# Patient Record
Sex: Male | Born: 1975 | Race: Black or African American | Hispanic: No | Smoking: Current some day smoker
Health system: Southern US, Community
[De-identification: ages and names within clinical notes are randomized; demographics above are authoritative.]

## PROBLEM LIST (undated history)

## (undated) DIAGNOSIS — N289 Disorder of kidney and ureter, unspecified: Secondary | ICD-10-CM

## (undated) DIAGNOSIS — I10 Essential (primary) hypertension: Secondary | ICD-10-CM

## (undated) DIAGNOSIS — E119 Type 2 diabetes mellitus without complications: Secondary | ICD-10-CM

## (undated) DIAGNOSIS — K859 Acute pancreatitis without necrosis or infection, unspecified: Secondary | ICD-10-CM

## (undated) HISTORY — PX: PERITONEAL SHUNT: SHX2225

---

## 2019-12-12 ENCOUNTER — Inpatient Hospital Stay (HOSPITAL_COMMUNITY)
Admission: EM | Admit: 2019-12-12 | Discharge: 2019-12-15 | DRG: 919 | Disposition: A | Discharge status: Home / Self Care | Payer: Medicaid - Out of State | Attending: Internal Medicine | Admitting: Internal Medicine

## 2019-12-12 ENCOUNTER — Emergency Department (HOSPITAL_COMMUNITY): Payer: Medicaid - Out of State

## 2019-12-12 ENCOUNTER — Other Ambulatory Visit: Payer: Self-pay

## 2019-12-12 ENCOUNTER — Encounter (HOSPITAL_COMMUNITY): Payer: Self-pay | Admitting: Emergency Medicine

## 2019-12-12 DIAGNOSIS — R231 Pallor: Secondary | ICD-10-CM | POA: Diagnosis present

## 2019-12-12 DIAGNOSIS — Y812 Prosthetic and other implants, materials and accessory general- and plastic-surgery devices associated with adverse incidents: Secondary | ICD-10-CM | POA: Diagnosis present

## 2019-12-12 DIAGNOSIS — R109 Unspecified abdominal pain: Secondary | ICD-10-CM | POA: Diagnosis present

## 2019-12-12 DIAGNOSIS — T85611A Breakdown (mechanical) of intraperitoneal dialysis catheter, initial encounter: Principal | ICD-10-CM | POA: Diagnosis present

## 2019-12-12 DIAGNOSIS — K859 Acute pancreatitis without necrosis or infection, unspecified: Secondary | ICD-10-CM | POA: Insufficient documentation

## 2019-12-12 DIAGNOSIS — F17210 Nicotine dependence, cigarettes, uncomplicated: Secondary | ICD-10-CM | POA: Diagnosis present

## 2019-12-12 DIAGNOSIS — E785 Hyperlipidemia, unspecified: Secondary | ICD-10-CM | POA: Diagnosis present

## 2019-12-12 DIAGNOSIS — I120 Hypertensive chronic kidney disease with stage 5 chronic kidney disease or end stage renal disease: Secondary | ICD-10-CM | POA: Diagnosis present

## 2019-12-12 DIAGNOSIS — Z992 Dependence on renal dialysis: Secondary | ICD-10-CM

## 2019-12-12 DIAGNOSIS — N2581 Secondary hyperparathyroidism of renal origin: Secondary | ICD-10-CM | POA: Diagnosis present

## 2019-12-12 DIAGNOSIS — N186 End stage renal disease: Secondary | ICD-10-CM | POA: Diagnosis present

## 2019-12-12 DIAGNOSIS — Z79899 Other long term (current) drug therapy: Secondary | ICD-10-CM

## 2019-12-12 DIAGNOSIS — E1165 Type 2 diabetes mellitus with hyperglycemia: Secondary | ICD-10-CM | POA: Diagnosis present

## 2019-12-12 DIAGNOSIS — K5900 Constipation, unspecified: Secondary | ICD-10-CM | POA: Diagnosis present

## 2019-12-12 DIAGNOSIS — E875 Hyperkalemia: Secondary | ICD-10-CM | POA: Diagnosis present

## 2019-12-12 DIAGNOSIS — K852 Alcohol induced acute pancreatitis without necrosis or infection: Secondary | ICD-10-CM | POA: Diagnosis present

## 2019-12-12 DIAGNOSIS — E1169 Type 2 diabetes mellitus with other specified complication: Secondary | ICD-10-CM | POA: Diagnosis not present

## 2019-12-12 DIAGNOSIS — Z794 Long term (current) use of insulin: Secondary | ICD-10-CM | POA: Diagnosis not present

## 2019-12-12 DIAGNOSIS — K861 Other chronic pancreatitis: Secondary | ICD-10-CM

## 2019-12-12 DIAGNOSIS — K860 Alcohol-induced chronic pancreatitis: Secondary | ICD-10-CM | POA: Diagnosis present

## 2019-12-12 DIAGNOSIS — D631 Anemia in chronic kidney disease: Secondary | ICD-10-CM | POA: Diagnosis present

## 2019-12-12 DIAGNOSIS — Z20822 Contact with and (suspected) exposure to covid-19: Secondary | ICD-10-CM | POA: Diagnosis present

## 2019-12-12 DIAGNOSIS — I10 Essential (primary) hypertension: Secondary | ICD-10-CM | POA: Diagnosis not present

## 2019-12-12 DIAGNOSIS — T85611S Breakdown (mechanical) of intraperitoneal dialysis catheter, sequela: Secondary | ICD-10-CM | POA: Diagnosis not present

## 2019-12-12 DIAGNOSIS — E1122 Type 2 diabetes mellitus with diabetic chronic kidney disease: Secondary | ICD-10-CM | POA: Diagnosis present

## 2019-12-12 DIAGNOSIS — K219 Gastro-esophageal reflux disease without esophagitis: Secondary | ICD-10-CM | POA: Diagnosis present

## 2019-12-12 HISTORY — DX: Acute pancreatitis without necrosis or infection, unspecified: K85.90

## 2019-12-12 HISTORY — DX: Disorder of kidney and ureter, unspecified: N28.9

## 2019-12-12 HISTORY — DX: Essential (primary) hypertension: I10

## 2019-12-12 HISTORY — DX: Type 2 diabetes mellitus without complications: E11.9

## 2019-12-12 LAB — BODY FLUID CELL COUNT WITH DIFFERENTIAL: Total Nucleated Cell Count, Fluid: 6 cu mm (ref 0–1000)

## 2019-12-12 LAB — COMPREHENSIVE METABOLIC PANEL
ALT: 13 U/L (ref 0–44)
AST: 12 U/L — ABNORMAL LOW (ref 15–41)
Albumin: 3.3 g/dL — ABNORMAL LOW (ref 3.5–5.0)
Alkaline Phosphatase: 92 U/L (ref 38–126)
Anion gap: 11 (ref 5–15)
BUN: 54 mg/dL — ABNORMAL HIGH (ref 6–20)
CO2: 22 mmol/L (ref 22–32)
Calcium: 8.6 mg/dL — ABNORMAL LOW (ref 8.9–10.3)
Chloride: 99 mmol/L (ref 98–111)
Creatinine, Ser: 9.85 mg/dL — ABNORMAL HIGH (ref 0.61–1.24)
GFR calc Af Amer: 7 mL/min — ABNORMAL LOW (ref >60)
GFR calc non Af Amer: 6 mL/min — ABNORMAL LOW (ref >60)
Glucose, Bld: 332 mg/dL — ABNORMAL HIGH (ref 70–99)
Potassium: 5.5 mmol/L — ABNORMAL HIGH (ref 3.5–5.1)
Sodium: 132 mmol/L — ABNORMAL LOW (ref 135–145)
Total Bilirubin: 0.6 mg/dL (ref 0.3–1.2)
Total Protein: 6.5 g/dL (ref 6.5–8.1)

## 2019-12-12 LAB — GRAM STAIN

## 2019-12-12 LAB — CBC
HCT: 40.2 % (ref 39.0–52.0)
Hemoglobin: 12.3 g/dL — ABNORMAL LOW (ref 13.0–17.0)
MCH: 29.6 pg (ref 26.0–34.0)
MCHC: 30.6 g/dL (ref 30.0–36.0)
MCV: 96.6 fL (ref 80.0–100.0)
Platelets: 275 10*3/uL (ref 150–400)
RBC: 4.16 MIL/uL — ABNORMAL LOW (ref 4.22–5.81)
RDW: 14.6 % (ref 11.5–15.5)
WBC: 5.6 10*3/uL (ref 4.0–10.5)
nRBC: 0 % (ref 0.0–0.2)

## 2019-12-12 LAB — CBG MONITORING, ED: Glucose-Capillary: 251 mg/dL — ABNORMAL HIGH (ref 70–99)

## 2019-12-12 LAB — LIPASE, BLOOD: Lipase: 46 U/L (ref 11–51)

## 2019-12-12 LAB — SARS CORONAVIRUS 2 BY RT PCR (HOSPITAL ORDER, PERFORMED IN ~~LOC~~ HOSPITAL LAB): SARS Coronavirus 2: NEGATIVE

## 2019-12-12 MED ORDER — CALCITRIOL 0.25 MCG PO CAPS
0.2500 ug | ORAL_CAPSULE | Freq: Every day | ORAL | Status: DC
  Administered 2019-12-12 – 2019-12-14 (×3): 0.25 ug via ORAL
  Filled 2019-12-12 (×5): qty 1

## 2019-12-12 MED ORDER — MORPHINE SULFATE (PF) 4 MG/ML IV SOLN
4.0000 mg | Freq: Once | INTRAVENOUS | Status: AC
  Administered 2019-12-12: 4 mg via INTRAVENOUS
  Filled 2019-12-12: qty 1

## 2019-12-12 MED ORDER — FUROSEMIDE 10 MG/ML IJ SOLN
40.0000 mg | Freq: Every day | INTRAMUSCULAR | Status: DC
  Administered 2019-12-12 – 2019-12-14 (×3): 40 mg via INTRAVENOUS
  Filled 2019-12-12 (×3): qty 4

## 2019-12-12 MED ORDER — DIPHENHYDRAMINE HCL 50 MG/ML IJ SOLN
25.0000 mg | Freq: Once | INTRAMUSCULAR | Status: AC
  Administered 2019-12-12: 25 mg via INTRAVENOUS
  Filled 2019-12-12: qty 1

## 2019-12-12 MED ORDER — AMLODIPINE BESYLATE 5 MG PO TABS: 10.0000 mg | ORAL_TABLET | Freq: Every day | ORAL | Status: DC

## 2019-12-12 MED ORDER — FENTANYL CITRATE (PF) 100 MCG/2ML IJ SOLN: 25.0000 ug | INTRAMUSCULAR | Status: DC | PRN

## 2019-12-12 MED ORDER — ONDANSETRON 4 MG PO TBDP
4.0000 mg | ORAL_TABLET | Freq: Once | ORAL | Status: AC | PRN
  Administered 2019-12-12: 4 mg via ORAL
  Filled 2019-12-12: qty 1

## 2019-12-12 MED ORDER — SODIUM ZIRCONIUM CYCLOSILICATE 10 G PO PACK
10.0000 g | PACK | Freq: Three times a day (TID) | ORAL | Status: AC
  Administered 2019-12-12 – 2019-12-13 (×3): 10 g via ORAL
  Filled 2019-12-12: qty 1
  Filled 2019-12-12: qty 2
  Filled 2019-12-12: qty 1

## 2019-12-12 MED ORDER — ACETAMINOPHEN 325 MG PO TABS
650.0000 mg | ORAL_TABLET | Freq: Four times a day (QID) | ORAL | Status: DC | PRN
  Administered 2019-12-13: 650 mg via ORAL
  Filled 2019-12-12: qty 2

## 2019-12-12 MED ORDER — METOPROLOL TARTRATE 50 MG PO TABS: 50.0000 mg | ORAL_TABLET | Freq: Two times a day (BID) | ORAL | Status: DC

## 2019-12-12 MED ORDER — PANTOPRAZOLE SODIUM 40 MG PO TBEC: 40.0000 mg | DELAYED_RELEASE_TABLET | Freq: Every day | ORAL | Status: DC

## 2019-12-12 MED ORDER — INSULIN ASPART 100 UNIT/ML ~~LOC~~ SOLN
0.0000 [IU] | Freq: Three times a day (TID) | SUBCUTANEOUS | Status: DC
  Administered 2019-12-13: 1 [IU] via SUBCUTANEOUS
  Administered 2019-12-13: 3 [IU] via SUBCUTANEOUS
  Administered 2019-12-13: 2 [IU] via SUBCUTANEOUS
  Administered 2019-12-14: 3 [IU] via SUBCUTANEOUS
  Administered 2019-12-14: 5 [IU] via SUBCUTANEOUS
  Administered 2019-12-14 – 2019-12-15 (×2): 1 [IU] via SUBCUTANEOUS
  Administered 2019-12-15: 3 [IU] via SUBCUTANEOUS

## 2019-12-12 MED ORDER — ONDANSETRON HCL 4 MG/2ML IJ SOLN: 4.0000 mg | Freq: Four times a day (QID) | INTRAMUSCULAR | Status: DC | PRN

## 2019-12-12 MED ORDER — HYDROMORPHONE HCL 1 MG/ML IJ SOLN
1.0000 mg | Freq: Once | INTRAMUSCULAR | Status: AC
  Administered 2019-12-12: 1 mg via INTRAVENOUS
  Filled 2019-12-12: qty 1

## 2019-12-12 MED ORDER — ACETAMINOPHEN 650 MG RE SUPP: 650.0000 mg | Freq: Four times a day (QID) | RECTAL | Status: DC | PRN

## 2019-12-12 MED ORDER — PANTOPRAZOLE SODIUM 40 MG PO TBEC
40.0000 mg | DELAYED_RELEASE_TABLET | Freq: Every day | ORAL | Status: DC
  Administered 2019-12-13 – 2019-12-14 (×2): 40 mg via ORAL
  Filled 2019-12-12 (×2): qty 1

## 2019-12-12 MED ORDER — FENTANYL CITRATE (PF) 100 MCG/2ML IJ SOLN: 12.5000 ug | INTRAMUSCULAR | Status: DC | PRN

## 2019-12-12 MED ORDER — HEPARIN SODIUM (PORCINE) 5000 UNIT/ML IJ SOLN
5000.0000 [IU] | Freq: Three times a day (TID) | INTRAMUSCULAR | Status: DC
  Administered 2019-12-12 – 2019-12-14 (×6): 5000 [IU] via SUBCUTANEOUS
  Filled 2019-12-12 (×7): qty 1

## 2019-12-12 MED ORDER — ONDANSETRON HCL 4 MG PO TABS: 4.0000 mg | ORAL_TABLET | Freq: Four times a day (QID) | ORAL | Status: DC | PRN

## 2019-12-12 MED ORDER — HYDRALAZINE HCL 50 MG PO TABS
100.0000 mg | ORAL_TABLET | Freq: Three times a day (TID) | ORAL | Status: DC
  Administered 2019-12-13 – 2019-12-14 (×7): 100 mg via ORAL
  Filled 2019-12-12 (×3): qty 4
  Filled 2019-12-12 (×4): qty 2
  Filled 2019-12-12: qty 4

## 2019-12-12 MED ORDER — HYDROMORPHONE HCL 1 MG/ML IJ SOLN
1.0000 mg | INTRAMUSCULAR | Status: DC | PRN
  Administered 2019-12-12 – 2019-12-14 (×9): 1 mg via INTRAVENOUS
  Filled 2019-12-12 (×9): qty 1

## 2019-12-12 MED ORDER — METOPROLOL TARTRATE 50 MG PO TABS
50.0000 mg | ORAL_TABLET | Freq: Two times a day (BID) | ORAL | Status: DC
  Administered 2019-12-13 – 2019-12-14 (×4): 50 mg via ORAL
  Filled 2019-12-12 (×5): qty 1

## 2019-12-12 MED ORDER — AMLODIPINE BESYLATE 10 MG PO TABS
10.0000 mg | ORAL_TABLET | Freq: Every day | ORAL | Status: DC
  Administered 2019-12-13 – 2019-12-14 (×2): 10 mg via ORAL
  Filled 2019-12-12: qty 1
  Filled 2019-12-12: qty 2

## 2019-12-12 MED ORDER — HYDRALAZINE HCL 25 MG PO TABS: 100.0000 mg | ORAL_TABLET | Freq: Three times a day (TID) | ORAL | Status: DC

## 2019-12-12 NOTE — ED Notes (Signed)
Pt in bed, pt states that he doesn't want the fentanyl, states that it doesn't help with his pain, pt requests more of the dilaudid.  md notified, md states that due to renal function he would like to give fentanyl and increased does, pt made aware, pt still would prefer dilaudid and would like to speak with md, md texted.

## 2019-12-12 NOTE — ED Provider Notes (Signed)
Emergency Department Provider Note   I have reviewed the triage vital signs and the nursing notes.   HISTORY  Chief Complaint Abdominal Pain   HPI Nathan Black is a 44 y.o. male with PMH reviewed below including chronic pancreatitis presents to the ED with abdominal pain and nausea.  Patient has had 2 days of symptoms which are progressively worsening.  He does peritoneal dialysis and over the past 2 nights suspect that he is not getting complete runs of dialysis.  He is having pain with nausea and some vomiting although mainly nausea today.  Pain is in the upper to mid abdomen and constant with no modifying factors.  He tells me that the equipment at home tells him to "check drain line" but does not see any obvious malfunction.  Denies any fevers or chills.  No pain around the drain site. No SOB or CP.  Past Medical History:  Diagnosis Date  . Diabetes mellitus without complication ([place])   . Hypertension   . Pancreatitis   . Renal disorder     Patient Active Problem List   Diagnosis Date Noted  . Acute pancreatitis 12/12/2019  . Chronic alcoholic pancreatitis ([place]) 12/12/2019  . Essential hypertension 12/12/2019  . Type 2 diabetes mellitus with other specified complication ([place]) 85/92/9244  . ESRD (end stage renal disease) ([place]) 12/12/2019  . 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 PERITONEAL SHUNT      Allergies Lisinopril  History reviewed. No pertinent family history.  Social History Social History   Tobacco Use  . Smoking status: Current Some Day Smoker    Packs/day: 1.00    Types: Cigarettes  . Smokeless tobacco: Never Used  Substance Use Topics  . Alcohol use: Not Currently  . Drug use: Not Currently    Review of Systems  Constitutional: No fever/chills Eyes: No visual changes. ENT: No sore throat. Cardiovascular: Denies chest pain. Respiratory: Denies  shortness of breath. Gastrointestinal: Positive abdominal pain. Positive nausea and vomiting.  No diarrhea.  No constipation. Genitourinary: Negative for dysuria. Musculoskeletal: Negative for back pain. Skin: Negative for rash. Neurological: Negative for headaches, focal weakness or numbness.  10-point ROS otherwise negative.  ____________________________________________   PHYSICAL EXAM:  VITAL SIGNS: ED Triage Vitals  Enc Vitals Group     BP 12/12/19 1138 (!) 204/95     Pulse Rate 12/12/19 1138 67     Resp 12/12/19 1138 20     Temp 12/12/19 1138 98.2 F (36.8 C)     Temp Source 12/12/19 1138 Oral     SpO2 12/12/19 1138 99 %     Weight 12/12/19 1141 152 lb (68.9 kg)     Height 12/12/19 1141 $RemoveBefor'5\' 7"'[id number]$  (1.702 m)   Constitutional: Alert and oriented. Well appearing and in no acute distress. Eyes: Conjunctivae are normal.  Head: Atraumatic. Nose: No congestion/rhinnorhea. Mouth/Throat: Mucous membranes are moist.   Neck: No stridor.  Cardiovascular: Normal rate, regular rhythm. Good peripheral circulation. Grossly normal heart sounds.   Respiratory: Normal respiratory effort.  No retractions. Lungs CTAB. Gastrointestinal: Soft with mild epigastric tenderness. No rebound or guarding. No tenderness around the dialysis cath line. No distention.  Musculoskeletal: No gross deformities of extremities. Neurologic:  Normal speech and language.  Skin:  Skin is warm, dry and intact. No rash noted.   ____________________________________________   LABS (all labs ordered are listed, but only abnormal results are displayed)  Labs Reviewed  COMPREHENSIVE METABOLIC PANEL - Abnormal; Notable for the following components:      Result Value   Sodium 132 (*)    Potassium 5.5 (*)    Glucose, Bld 332 (*)    BUN 54 (*)    Creatinine, Ser 9.85 (*)    Calcium 8.6 (*)    Albumin 3.3 (*)    AST 12 (*)    GFR calc non Af Amer 6 (*)    GFR calc Af Amer 7 (*)    All other components within  normal limits  CBC - Abnormal; Notable for the following components:   RBC 4.16 (*)    Hemoglobin 12.3 (*)    All other components within normal limits  BODY FLUID CELL COUNT WITH DIFFERENTIAL - Abnormal; Notable for the following components:   Color, Fluid COLORLESS (*)    All other components within normal limits  CBG MONITORING, ED - Abnormal; Notable for the following components:   Glucose-Capillary 251 (*)    All other components within normal limits  GRAM STAIN  SARS CORONAVIRUS 2 BY RT PCR (HOSPITAL ORDER, [place] [facility])  BODY FLUID CULTURE  LIPASE, BLOOD  PATHOLOGIST SMEAR REVIEW  HIV ANTIBODY (ROUTINE TESTING W REFLEX)  BASIC METABOLIC PANEL  CBC  HEMOGLOBIN A1C   ____________________________________________  RADIOLOGY  CT ABDOMEN PELVIS WO CONTRAST  Result Date: 12/12/2019 CLINICAL DATA:  Abdominal pain, nausea and vomiting, peritoneal dialysis EXAM: CT ABDOMEN AND PELVIS WITHOUT CONTRAST TECHNIQUE: Multidetector CT imaging of the abdomen and pelvis was performed following the standard protocol without IV contrast. COMPARISON:  03/15/2019 FINDINGS: Lower chest: No acute pleural or parenchymal lung disease. Hepatobiliary: No focal liver abnormality is seen. No gallstones, gallbladder wall thickening, or biliary dilatation. Pancreas: Diffuse pancreatic calcifications consistent with chronic calcific pancreatitis. No acute inflammatory change. Spleen: Normal in size without focal abnormality. Adrenals/Urinary Tract: No urinary tract calculi or obstructive uropathy. The adrenals are unremarkable. The bladder is normal. Stomach/Bowel: No bowel obstruction or ileus. No wall thickening or inflammatory change. Normal appendix right lower quadrant. Vascular/Lymphatic: Aortic atherosclerosis. No enlarged abdominal or pelvic lymph nodes. Reproductive: Prostate is unremarkable. Other: Small volume free fluid throughout the abdomen and pelvis likely related to  peritoneal dialysis. Peritoneal dialysis catheter is coiled within the left upper quadrant. No free intraperitoneal gas. No abdominal wall hernia. Musculoskeletal: No acute or destructive bony lesions. Reconstructed images demonstrate no additional findings. IMPRESSION: 1. Small volume of free fluid throughout the abdomen and pelvis likely reflecting residual fluid from peritoneal dialysis. 2. Sequela of chronic calcific pancreatitis. 3. Otherwise unremarkable unenhanced exam. Electronically Signed   By: Randa Ngo M.D.   On: 12/12/2019 15:48    ____________________________________________   PROCEDURES  Procedure(s) performed:   Procedures  None  ____________________________________________   INITIAL IMPRESSION / ASSESSMENT AND PLAN / ED COURSE  Pertinent labs & imaging results that were available during my care of the patient were reviewed by me and considered in my medical decision making (see chart for details).   Patient presents to the emergency department abdominal pain with nausea vomiting.  He arrives hypertensive with mid to epigastric abdominal tenderness.  I attempted to draw peritoneal fluid using sterile technique via PD cath but was only able to obtain a small amount of clear, nonbloody fluid.  We will send this for cell count but did not have enough to send for culture Gram stain.  Plan for noncontrast CT of the abdomen and pelvis.   05:00 PM  Labs reviewed. No concern for SBP on review of peritoneal fluid. Will send for culture if I have enough to send. CT with no acute findings. Patient does have a Quaniyah Bugh history of chronic pancreatitis and a calcified pancreas on CT. Question chronic pancreatitis flare with normal lipase here. Plan to keep patient NPO and admit for bowel rest.  Discussed patient's case with [facility] to request admission. Patient and family (if present) updated with plan. Care transferred to [facility] service.  I reviewed all nursing notes, vitals, pertinent old  records, EKGs, labs, imaging (as available).  ____________________________________________  FINAL CLINICAL IMPRESSION(S) / ED DIAGNOSES  Final diagnoses:  Chronic pancreatitis, unspecified pancreatitis type ([place])    MEDICATIONS GIVEN DURING THIS VISIT:  Medications  calcitRIOL (ROCALTROL) capsule 0.25 mcg (0.25 mcg Oral Given 12/12/19 2033)  acetaminophen (TYLENOL) tablet 650 mg (has no administration in time range)    Or  acetaminophen (TYLENOL) suppository 650 mg (has no administration in time range)  ondansetron (ZOFRAN) tablet 4 mg (has no administration in time range)    Or  ondansetron (ZOFRAN) injection 4 mg (has no administration in time range)  furosemide (LASIX) injection 40 mg (40 mg Intravenous Given 12/12/19 2034)  sodium zirconium cyclosilicate (LOKELMA) packet 10 g (has no administration in time range)  insulin aspart (novoLOG) injection 0-6 Units (has no administration in time range)  heparin injection 5,000 Units (has no administration in time range)  pantoprazole (PROTONIX) EC tablet 40 mg (has no administration in time range)  metoprolol tartrate (LOPRESSOR) tablet 50 mg (has no administration in time range)  hydrALAZINE (APRESOLINE) tablet 100 mg (has no administration in time range)  amLODipine (NORVASC) tablet 10 mg (has no administration in time range)  fentaNYL (SUBLIMAZE) injection 25 mcg (has no administration in time range)  ondansetron (ZOFRAN-ODT) disintegrating tablet 4 mg (4 mg Oral Given 12/12/19 1148)  morphine 4 MG/ML injection 4 mg (4 mg Intravenous Given 12/12/19 1530)  diphenhydrAMINE (BENADRYL) injection 25 mg (25 mg Intravenous Given 12/12/19 1551)  HYDROmorphone (DILAUDID) injection 1 mg (1 mg Intravenous Given 12/12/19 1801)    Note:  This document was prepared using Dragon voice recognition software and may include unintentional dictation errors.  Nanda Quinton, MD, [facility]    Iyauna Sing, [facility], MD 12/12/19 2223

## 2019-12-12 NOTE — ED Triage Notes (Signed)
Patient is having abdominal pain, performs peritoneal dialysis last 5-6 months, last night equipment stated check drain line, doesn't believe treatment was complete, currently having lower abdominal pain.

## 2019-12-12 NOTE — ED Notes (Signed)
Pt in bed, pt appears to have some hives on his L arm, pt appears to have a localized allergic response to the morphine, md notified, benadryl given

## 2019-12-12 NOTE — H&P (Signed)
History and Physical    [name]:[id number] DOB: [birth year] DOA: 12/12/2019  PCP: Patient, No Pcp Per   Patient coming from: home   Chief Complaint: abdominal pain.   HPI: Nathan Black is a 44 y.o. male with medical history significant of end-stage renal disease on peritoneal dialysis, type 2 diabetes mellitus, hypertension and history of chronic alcoholic pancreatitis. Patient reports not feeling well for the last 2 days, generalized malaise, poor appetite, but no frank fevers or chills.  He does peritoneal dialysis at home, last night his dialysis machine alerted him malfunction of the peritoneal drain.  He was not able to complete the full peritoneal dialysis yesterday.  He reports severe abdominal pain for the last 24 hours, mainly on the left side, worse to touch and movement, no improving factors, associated with nausea and vomiting.  Denies any sick contacts, he has not been vaccinated for COVID-19.  ED Course: Patient with severe abdominal pain despite intravenous morphine, work-up included CT of the abdomen with changes consistent with chronic pancreatitis, pancreatic enzymes within normal ranges, peritoneal fluid sampling negative for infection.  He was referred to the hospital with a working diagnosis of possible chronic pancreatitis flare.   Review of Systems:  1. General: No fevers, no chills, no poor appetites and generalized malaise.  2. ENT: No runny nose or sore throat, no hearing disturbances 3. Pulmonary: No dyspnea, cough, wheezing, or hemoptysis 4. Cardiovascular: No angina, claudication, lower extremity edema, pnd or orthopnea 5. Gastrointestinal: positive nausea and vomiting, no diarrhea or constipation. Abdominal pain as mentioned in HPI.  6. Hematology: No easy bruisability or frequent infections 7. Urology: No dysuria, hematuria or increased urinary frequency. Continue ,making urine.  8. Dermatology: No rashes. 9. Neurology: No seizures or paresthesias 10.  Musculoskeletal: No joint pain or deformities  Past Medical History:  Diagnosis Date  . Diabetes mellitus without complication ([place])   . Hypertension   . Pancreatitis   . Renal disorder     Past Surgical History:  Procedure Laterality Date  . PERITONEAL SHUNT       reports that he has been smoking cigarettes. He has been smoking about 1.00 pack per day. He has never used smokeless tobacco. He reports previous alcohol use. He reports previous drug use.  Allergies  Allergen Reactions  . Lisinopril     swelling    History reviewed. No pertinent family history.   Prior to Admission medications   Medication Sig Start Date End Date Taking? Authorizing Provider  amLODipine (NORVASC) 5 MG tablet Take 5 mg by mouth daily.   Yes [provider]  atorvastatin (LIPITOR) 40 MG tablet Take 40 mg by mouth daily.   Yes [provider]  calcitRIOL (ROCALTROL) 0.25 MCG capsule Take 0.25 mcg by mouth daily.   Yes [provider]  furosemide (LASIX) 40 MG tablet Take 40 mg by mouth daily.   Yes [provider]  hydrALAZINE (APRESOLINE) 100 MG tablet Take 100 mg by mouth 3 (three) times daily.   Yes [provider]  metoprolol tartrate (LOPRESSOR) 50 MG tablet Take 50 mg by mouth 2 (two) times daily.   Yes [provider]  pantoprazole (PROTONIX) 40 MG tablet Take 40 mg by mouth daily.   Yes [provider]    Physical Exam: Vitals:   12/12/19 1138 12/12/19 1141 12/12/19 1400 12/12/19 1553  BP: (!) 204/95  (!) 210/81 (!) 192/116  Pulse: 67  65 (!) 58  Resp: 20   18  Temp: 98.2 F (36.8 C)  98.2 F (36.8 C)   TempSrc: Oral  Oral   SpO2: 99%  100% 100%  Weight:  68.9 kg    Height:  $Remove'5\' 7"'sRrFtgI$  (1.702 m)      Vitals:   12/12/19 1138 12/12/19 1141 12/12/19 1400 12/12/19 1553  BP: (!) 204/95  (!) 210/81 (!) 192/116  Pulse: 67  65 (!) 58  Resp: 20   18  Temp: 98.2 F (36.8 C)  98.2 F (36.8 C)   TempSrc: Oral  Oral   SpO2: 99%   100% 100%  Weight:  68.9 kg    Height:  $Remove'5\' 7"'huyghWC$  (1.702 m)     General: deconditioned and in pain.  Neurology: Awake and alert, non focal Head and Neck. Head normocephalic. Neck supple with no adenopathy or thyromegaly.   E ENT: positive pallor, no icterus, oral mucosa moist Cardiovascular: No JVD. S1-S2 present, rhythmic, no gallops, rubs, or murmurs. Trace bilateral non pitting lower extremity edema. Pulmonary: vesicular breath sounds bilaterally, adequate air movement, no wheezing, rhonchi or rales. Gastrointestinal. Abdomen soft but tender to superficial palpation. Peritoneal catheter in place, with no local erythema.  Skin. No rashes Musculoskeletal: no joint deformities    Labs on Admission: I have personally reviewed following labs and imaging studies  CBC: Recent Labs  Lab 12/12/19 1425  WBC 5.6  HGB 12.3*  HCT 40.2  MCV 96.6  PLT 355   Basic Metabolic Panel: Recent Labs  Lab 12/12/19 1425  NA 132*  K 5.5*  CL 99  CO2 22  GLUCOSE 332*  BUN 54*  CREATININE 9.85*  CALCIUM 8.6*   GFR: Estimated Creatinine Clearance: 8.9 mL/min (A) (by C-G formula based on SCr of 9.85 mg/dL (H)). Liver Function Tests: Recent Labs  Lab 12/12/19 1425  AST 12*  ALT 13  ALKPHOS 92  BILITOT 0.6  PROT 6.5  ALBUMIN 3.3*   Recent Labs  Lab 12/12/19 1425  LIPASE 46   No results for input(s): AMMONIA in the last 168 hours. Coagulation Profile: No results for input(s): INR, PROTIME in the last 168 hours. Cardiac Enzymes: No results for input(s): CKTOTAL, CKMB, CKMBINDEX, TROPONINI in the last 168 hours. BNP (last 3 results) No results for input(s): PROBNP in the last 8760 hours. HbA1C: No results for input(s): HGBA1C in the last 72 hours. CBG: No results for input(s): GLUCAP in the last 168 hours. Lipid Profile: No results for input(s): CHOL, HDL, LDLCALC, TRIG, CHOLHDL, LDLDIRECT in the last 72 hours. Thyroid Function Tests: No results for input(s): TSH, T4TOTAL,  FREET4, T3FREE, THYROIDAB in the last 72 hours. Anemia Panel: No results for input(s): VITAMINB12, FOLATE, FERRITIN, TIBC, IRON, RETICCTPCT in the last 72 hours. Urine analysis: No results found for: COLORURINE, APPEARANCEUR, LABSPEC, [place], GLUCOSEU, HGBUR, BILIRUBINUR, KETONESUR, PROTEINUR, UROBILINOGEN, NITRITE, LEUKOCYTESUR  Radiological Exams on Admission: CT ABDOMEN PELVIS WO CONTRAST  Result Date: 12/12/2019 CLINICAL DATA:  Abdominal pain, nausea and vomiting, peritoneal dialysis EXAM: CT ABDOMEN AND PELVIS WITHOUT CONTRAST TECHNIQUE: Multidetector CT imaging of the abdomen and pelvis was performed following the standard protocol without IV contrast. COMPARISON:  03/15/2019 FINDINGS: Lower chest: No acute pleural or parenchymal lung disease. Hepatobiliary: No focal liver abnormality is seen. No gallstones, gallbladder wall thickening, or biliary dilatation. Pancreas: Diffuse pancreatic calcifications consistent with chronic calcific pancreatitis. No acute inflammatory change. Spleen: Normal in size without focal abnormality. Adrenals/Urinary Tract: No urinary tract calculi or obstructive uropathy. The adrenals are unremarkable. The bladder is normal. Stomach/Bowel: No  bowel obstruction or ileus. No wall thickening or inflammatory change. Normal appendix right lower quadrant. Vascular/Lymphatic: Aortic atherosclerosis. No enlarged abdominal or pelvic lymph nodes. Reproductive: Prostate is unremarkable. Other: Small volume free fluid throughout the abdomen and pelvis likely related to peritoneal dialysis. Peritoneal dialysis catheter is coiled within the left upper quadrant. No free intraperitoneal gas. No abdominal wall hernia. Musculoskeletal: No acute or destructive bony lesions. Reconstructed images demonstrate no additional findings. IMPRESSION: 1. Small volume of free fluid throughout the abdomen and pelvis likely reflecting residual fluid from peritoneal dialysis. 2. Sequela of chronic  calcific pancreatitis. 3. Otherwise unremarkable unenhanced exam. Electronically Signed   By: Randa Ngo M.D.   On: 12/12/2019 15:48    EKG: Independently reviewed. 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  He had difficulties performing home peritoneal dialysis yesterday due to possible catheter malfunction.  For the last 2 days not feeling well with generalized malaise, poor appetite, nausea and vomiting.  Positive abdominal pain on the left side, moderate to severe in intensity.  On his initial physical examination temperature 98.5, blood pressure 209/109, heart rate 78, respiratory rate 17, oxygen saturation 100%.  Mild pallor, lungs clear to auscultation bilaterally, heart S1-S2, present rhythmic, abdomen tender to superficial palpation in the left side, peritoneal dialysis in place, no site erythema, positive bilateral trace lower extremity edema. Sodium 132, potassium 5.5, chloride 99, bicarb 22, glucose 332, BUN 54, creatinine 9.8, lipase 46, AST 12, ALT 13, white count 5.6, hemoglobin 12.3, hematocrit 40.2, platelets 275.  SARS COVID-19 pending.  Peritoneal fluid with 6 nucleated cells.  CT of the abdomen with small volume of free fluid throughout the abdomen and pelvis residual fluid from peritoneal dialysis.  Chronic sequela I of calcific pancreatitis.   Patient will be admitted to the hospital working diagnosis of abdominal pain possible recurrence of pancreatitis, rule out dialysis catheter  malfunction.   1.  Abdominal pain, possible acute exacerbation of chronic pancreatitis, rule out dialysis catheter malfunction. Continue supportive medical care with intravenous analgesics with fentanyl, clear liquid diet, antiacids and as needed antiemetics. Consult nephrology to continue peritoneal dialysis.  2.  End-stage renal disease, peritoneal dialysis/hyperkalemia.  Patient with no severe volume overload, bicarb down to 22.  Potassium 5.5.  No indication for urgent dialysis, will add sodium zirconium x3 doses for hyperkalemia and follow-up kidney function in the morning.  Consult nephrology to continue peritoneal dialysis during his hospitalization.  3.  Uncontrolled hypertension.  Continue hydralazine 100 mg 3 times daily and metoprolol 50 mg twice daily.  Will increase amlodipine to 10 mg and will change furosemide to intravenous formulation 40 mg daily.  4.  GERD.  Continue pantoprazole  5.  Type 2 diabetes mellitus (uncontrolled/hyperglycemia) /dyslipidemia..  Add insulin sliding scale for glucose coverage and monitoring.  Check hemoglobin A1c. Continue atorvastatin.  Status is: Inpatient  Remains inpatient appropriate because:IV treatments appropriate due to intensity of illness or inability to take PO   Dispo: The patient is from: Home  Anticipated d/c is to: Home              Anticipated d/c date is: 2 days              Patient currently is not medically stable to d/c.    DVT prophylaxis: Heparin   Code Status:   full  Family Communication:  No family at the beside     Consults called:  Nephrology   Admission status:  Inpatient.    Jailyne Chieffo Gerome Apley MD [facility]   12/12/2019, 5:23 PM

## 2019-12-13 DIAGNOSIS — E1169 Type 2 diabetes mellitus with other specified complication: Secondary | ICD-10-CM

## 2019-12-13 DIAGNOSIS — Z794 Long term (current) use of insulin: Secondary | ICD-10-CM

## 2019-12-13 LAB — GLUCOSE, CAPILLARY
Glucose-Capillary: 154 mg/dL — ABNORMAL HIGH (ref 70–99)
Glucose-Capillary: 176 mg/dL — ABNORMAL HIGH (ref 70–99)
Glucose-Capillary: 199 mg/dL — ABNORMAL HIGH (ref 70–99)
Glucose-Capillary: 213 mg/dL — ABNORMAL HIGH (ref 70–99)
Glucose-Capillary: 245 mg/dL — ABNORMAL HIGH (ref 70–99)
Glucose-Capillary: 270 mg/dL — ABNORMAL HIGH (ref 70–99)

## 2019-12-13 LAB — CBC
HCT: 32.5 % — ABNORMAL LOW (ref 39.0–52.0)
Hemoglobin: 10.2 g/dL — ABNORMAL LOW (ref 13.0–17.0)
MCH: 29.4 pg (ref 26.0–34.0)
MCHC: 31.4 g/dL (ref 30.0–36.0)
MCV: 93.7 fL (ref 80.0–100.0)
Platelets: 225 10*3/uL (ref 150–400)
RBC: 3.47 MIL/uL — ABNORMAL LOW (ref 4.22–5.81)
RDW: 14.3 % (ref 11.5–15.5)
WBC: 4.3 10*3/uL (ref 4.0–10.5)
nRBC: 0 % (ref 0.0–0.2)

## 2019-12-13 LAB — BASIC METABOLIC PANEL
Anion gap: 9 (ref 5–15)
BUN: 58 mg/dL — ABNORMAL HIGH (ref 6–20)
CO2: 23 mmol/L (ref 22–32)
Calcium: 8.1 mg/dL — ABNORMAL LOW (ref 8.9–10.3)
Chloride: 99 mmol/L (ref 98–111)
Creatinine, Ser: 10.01 mg/dL — ABNORMAL HIGH (ref 0.61–1.24)
GFR calc Af Amer: 7 mL/min — ABNORMAL LOW (ref >60)
GFR calc non Af Amer: 6 mL/min — ABNORMAL LOW (ref >60)
Glucose, Bld: 291 mg/dL — ABNORMAL HIGH (ref 70–99)
Potassium: 4.6 mmol/L (ref 3.5–5.1)
Sodium: 131 mmol/L — ABNORMAL LOW (ref 135–145)

## 2019-12-13 LAB — LIPASE, BLOOD: Lipase: 59 U/L — ABNORMAL HIGH (ref 11–51)

## 2019-12-13 LAB — MRSA PCR SCREENING: MRSA by PCR: NEGATIVE

## 2019-12-13 LAB — HEMOGLOBIN A1C
Hgb A1c MFr Bld: 11.2 % — ABNORMAL HIGH (ref 4.8–5.6)
Mean Plasma Glucose: 274.74 mg/dL

## 2019-12-13 LAB — PROCALCITONIN: Procalcitonin: 0.14 ng/mL

## 2019-12-13 LAB — HIV ANTIBODY (ROUTINE TESTING W REFLEX): HIV Screen 4th Generation wRfx: NONREACTIVE

## 2019-12-13 LAB — PATHOLOGIST SMEAR REVIEW

## 2019-12-13 MED ORDER — SIMETHICONE 40 MG/0.6ML PO SUSP
ORAL | Status: AC
  Filled 2019-12-13: qty 0.6

## 2019-12-13 MED ORDER — OXYCODONE HCL 5 MG PO TABS
5.0000 mg | ORAL_TABLET | ORAL | Status: DC | PRN
  Administered 2019-12-13 – 2019-12-14 (×5): 10 mg via ORAL
  Filled 2019-12-13 (×5): qty 2

## 2019-12-13 MED ORDER — NICOTINE 21 MG/24HR TD PT24
21.0000 mg | MEDICATED_PATCH | Freq: Every day | TRANSDERMAL | Status: DC
  Administered 2019-12-13 – 2019-12-14 (×2): 21 mg via TRANSDERMAL
  Filled 2019-12-13 (×2): qty 1

## 2019-12-13 NOTE — Progress Notes (Signed)
Care link here to transport patient. Report called to [facility].

## 2019-12-13 NOTE — Consult Note (Signed)
ESRD Consult Note [facility]  Requesting provider: Karie Kirks, DO Reason for consult: ESRD, PD catheter malfunction  Outpatient dialysis unit: [place] [facility] dialysis schedule: PD  Assessment/Recommendations: Nathan Black is a/an 44 y.o. male with a past medical history notable for ESRD on PD admitted with abdominal pain.   # ESRD on CCPD: orders per patient: 7hrs 20 min, 4 cycles, 1.5hr dwell time. Mainly utilizes 1.25 or 2.5% bags. No day dwell -needs to be transferred to [facility] to have his home surgeons address catheter malfunction. Unable to do PD at [facility] unfortunately. No urgency/indication for renal replacement therapy at this time, volume status and electrolytes are okay. Not exhibiting any uremic symptoms  # Volume/ hypertension: overall euvolemic. Continue with home anti-HTN's. Spikes in BP likely related to abdominal pain  # Anemia of Chronic Kidney Disease: Hemoglobin 10.2.  # Secondary Hyperparathyroidism/Hyperphosphatemia: check phos and albumin   # Vascular access: PD cath site c/d/i  # Abdominal pain/chronic pancreatitis: supportive care, mgmt per primary service -Preferred narcotic agents for pain control are hydromorphone, fentanyl, and methadone. Morphine should not be used. Avoid Baclofen and avoid oral sodium phosphate and magnesium citrate based laxatives / bowel preps.  # Additional recommendations: - Dose all meds for creatinine clearance < 10 ml/min  - Unless absolutely necessary, no MRIs with gadolinium.  - Implement save arm precautions.  Prefer needle sticks in the dorsum of the hands or wrists.  No blood pressure measurements in arm. - If blood transfusion is requested during hemodialysis sessions, please alert [place] prior to the session.  - If a dialysis catheter line culture is requested, please alert [place] as only hemodialysis nurses are able to collect those specimens.   Recommendations were discussed with the primary  team.   History of Present Illness: Nathan Black is a/an 44 y.o. male with a past medical history of ESRD on PD, DM2, HTN, HLD who presents with abdominal pain. Has been having difficulty performing PD due to drain alarms and noticed that he is not draining as effectively as before. Still might have some fluid in his abdomen due to the fact that EMS arrived when he was still on PD. Denies any changes in fluid characteristics in PD effluent. Never had similar issues with PD in the past. Has poor appetite and n/v associated with abdominal pain. Denies fevers, chest pain, SOB, diminished urinary frequency. Still makes urine. No similar issues in the past   Medications:  Current Facility-Administered Medications  Medication Dose Route Frequency Provider Last Rate Last Admin  . acetaminophen (TYLENOL) tablet 650 mg  650 mg Oral Q6H PRN Arrien, Jimmy Picket, MD   650 mg at 12/13/19 1037   Or  . acetaminophen (TYLENOL) suppository 650 mg  650 mg Rectal Q6H PRN Arrien, Jimmy Picket, MD      . amLODipine (NORVASC) tablet 10 mg  10 mg Oral Daily Tawni Millers, MD   10 mg at 12/13/19 0626  . calcitRIOL (ROCALTROL) capsule 0.25 mcg  0.25 mcg Oral Daily Arrien, Jimmy Picket, MD   0.25 mcg at 12/13/19 1032  . furosemide (LASIX) injection 40 mg  40 mg Intravenous Daily Arrien, Jimmy Picket, MD   40 mg at 12/13/19 1032  . heparin injection 5,000 Units  5,000 Units Subcutaneous Q8H Arrien, Jimmy Picket, MD   5,000 Units at 12/13/19 [phone]  . hydrALAZINE (APRESOLINE) tablet 100 mg  100 mg Oral TID Tawni Millers, MD   100 mg at 12/13/19 1031  . HYDROmorphone (  DILAUDID) injection 1 mg  1 mg Intravenous Q4H PRN Arrien, Jimmy Picket, MD   1 mg at 12/13/19 0824  . insulin aspart (novoLOG) injection 0-6 Units  0-6 Units Subcutaneous TID WC Arrien, Jimmy Picket, MD   3 Units at 12/13/19 [phone]  . metoprolol tartrate (LOPRESSOR) tablet 50 mg  50 mg Oral BID Tawni Millers, MD    50 mg at 12/13/19 3474  . ondansetron (ZOFRAN) tablet 4 mg  4 mg Oral Q6H PRN Arrien, Jimmy Picket, MD       Or  . ondansetron [facility]) injection 4 mg  4 mg Intravenous Q6H PRN Arrien, Jimmy Picket, MD      . pantoprazole (PROTONIX) EC tablet 40 mg  40 mg Oral Daily Tawni Millers, MD   40 mg at 12/13/19 2595  . simethicone (MYLICON) 40 GL/8.7FI suspension           . sodium zirconium cyclosilicate (LOKELMA) packet 10 g  10 g Oral TID Tawni Millers, MD   10 g at 12/13/19 1031     ALLERGIES Lisinopril  MEDICAL HISTORY Past Medical History:  Diagnosis Date  . Diabetes mellitus without complication ([place])   . Hypertension   . Pancreatitis   . Renal disorder      SOCIAL HISTORY Social History   Socioeconomic History  . Marital status: Legally Separated    Spouse name: Not on file  . Number of children: Not on file  . Years of education: Not on file  . Highest education level: Not on file  Occupational History  . Not on file  Tobacco Use  . Smoking status: Current Some Day Smoker    Packs/day: 1.00    Types: Cigarettes  . Smokeless tobacco: Never Used  Substance and Sexual Activity  . Alcohol use: Not Currently  . Drug use: Not Currently  . Sexual activity: Not on file  Other Topics Concern  . Not on file  Social History Narrative  . Not on file   Social Determinants of Health   Financial Resource Strain:   . Difficulty of Paying Living Expenses: Not on file  Food Insecurity:   . Worried About Charity fundraiser in the Last Year: Not on file  . Ran Out of Food in the Last Year: Not on file  Transportation Needs:   . Lack of Transportation (Medical): Not on file  . Lack of Transportation (Non-Medical): Not on file  Physical Activity:   . Days of Exercise per Week: Not on file  . Minutes of Exercise per Session: Not on file  Stress:   . Feeling of Stress : Not on file  Social Connections:   . Frequency of Communication with Friends and  Family: Not on file  . Frequency of Social Gatherings with Friends and Family: Not on file  . Attends Religious Services: Not on file  . Active Member of Clubs or Organizations: Not on file  . Attends Archivist Meetings: Not on file  . Marital Status: Not on file  Intimate Partner Violence:   . Fear of Current or Ex-Partner: Not on file  . Emotionally Abused: Not on file  . Physically Abused: Not on file  . Sexually Abused: Not on file     FAMILY HISTORY History reviewed. No pertinent family history.   Review of Systems: 12 systems were reviewed and negative except per HPI  Physical Exam: Vitals:   12/13/19 0625 12/13/19 0830  BP: (!) 153/78 (!) 145/90  Pulse: 63 63  Resp:  18  Temp:  98 F (36.7 C)  SpO2:  98%   No intake/output data recorded.  Intake/Output Summary (Last 24 hours) at 12/13/2019 1104 Last data filed at 12/13/2019 0600 Gross per 24 hour  Intake 720 ml  Output --  Net 720 ml   General: well-appearing, no acute distress HEENT: anicteric sclera, MMM CV: normal rate, no murmurs, no edema Lungs: bilateral chest rise, normal wob Abd: soft, slightly distended, tender to palpation in epigastric and LLQ regions. PD site: c/d/i, no tunnel tenderness Skin: no visible lesions or rashes Psych: alert, engaged, appropriate mood and affect Neuro: normal speech, no gross focal deficits   Test Results Reviewed Lab Results  Component Value Date   NA 131 (L) 12/13/2019   K 4.6 12/13/2019   CL 99 12/13/2019   CO2 23 12/13/2019   BUN 58 (H) 12/13/2019   CREATININE 10.01 (H) 12/13/2019   CALCIUM 8.1 (L) 12/13/2019   ALBUMIN 3.3 (L) 12/12/2019    I have reviewed relevant outside healthcare records

## 2019-12-13 NOTE — Progress Notes (Signed)
PROGRESS NOTE  Nathan Black XBM:[id number] DOB: 1975/10/09 DOA: 12/12/2019 PCP: Patient, No Pcp Per  Brief History   Nathan Black is a 44 y.o. male with medical history significant of end-stage renal disease on peritoneal dialysis, type 2 diabetes mellitus, hypertension and history of chronic alcoholic pancreatitis. Patient reports not feeling well for the last 2 days, generalized malaise, poor appetite, but no frank fevers or chills.  He does peritoneal dialysis at home, last night his dialysis machine alerted him malfunction of the peritoneal drain.  He was not able to complete the full peritoneal dialysis yesterday.  He reports severe abdominal pain for the last 24 hours, mainly on the left side, worse to touch and movement, no improving factors, associated with nausea and vomiting.  Denies any sick contacts, he has not been vaccinated for COVID-19.  [facility] were consulted to admit the patient for further treatment and evaluation of malfunctioning PD catheter and abdominal pain.  As it seems that [facility] cannot provide any PD catheter/dialysis related needs, attempts were made to transfer the patient to [place], RN discussed the patient with Dr. Hulen Skains, [place]. He stated that as the patient's catheter was placed in [place], they should accept the patient back despite diversion status. The patient was discussed both with the hospitalist who was on for admissions and the house supervisor. They both stated that they were not accepting transfers from any outside facilities and were on diversion. The patient will be transferred to [facility] as he will likely require HD as his PD catheter is not working.   Consultants  . Nephrology  Procedures  . None  Antibiotics   Anti-infectives (From admission, onward)   None    .  Subjective  The patient is sitting up on the edge of his bed. He continues to complain of abdominal pain. No new  complaints.  Objective   Vitals:  Vitals:   12/13/19 0625 12/13/19 0830  BP: (!) 153/78 (!) 145/90  Pulse: 63 63  Resp:  18  Temp:  98 F (36.7 C)  SpO2:  98%   Exam:  Constitutional:  . The patient is awake, alert, and oriented x 3. No acute distress. Respiratory:  . No increased work of breathing. . No wheezes, rales, or rhonchi . No tactile fremitus Cardiovascular:  . Regular rate and rhythm . No murmurs, ectopy, or gallups. . No lateral PMI. No thrills. Abdomen:  . Abdomen is soft,  non-distended . The abdomen is tender, especially in the left upper quadrant. . No hernias, masses, or organomegaly . Hypoactive bowel sounds.  Musculoskeletal:  . No cyanosis, clubbing, or edema Skin:  . No rashes, lesions, ulcers . palpation of skin: no induration or nodules Neurologic:  . CN 2-12 intact . Sensation all 4 extremities intact Psychiatric:  . Mental status o Mood, affect appropriate o Orientation to person, place, time  . judgment and insight appear intact   I have personally reviewed the following:   Today's Data  . Vitals, BMP, CBC, procalcitonin  Micro Data  . Body fluid culture (12/12/2019) No growth . Blood cultures x 2(12/13/2019): Pending  Imaging  CT abd/pelvis: 1. Small volume of free fluid throughout the abdomen and pelvis likely reflecting residual fluid from peritoneal dialysis. 2. Sequela of chronic calcific pancreatitis. . 3. Otherwise unremarkable unenhanced exam.  Scheduled Meds: . amLODipine  10 mg Oral Daily  . calcitRIOL  0.25 mcg Oral Daily  . furosemide  40  mg Intravenous Daily  . heparin injection (subcutaneous)  5,000 Units Subcutaneous Q8H  . hydrALAZINE  100 mg Oral TID  . insulin aspart  0-6 Units Subcutaneous TID WC  . metoprolol tartrate  50 mg Oral BID  . pantoprazole  40 mg Oral Daily  . simethicone      . sodium zirconium cyclosilicate  10 g Oral TID   Continuous Infusions:  Principal Problem:   Acute  pancreatitis Active Problems:   Chronic alcoholic pancreatitis ([place])   Essential hypertension   Type 2 diabetes mellitus with other specified complication (HCC)   ESRD (end stage renal disease) (HCC)   Dependence on continuous ambulatory peritoneal dialysis ([place])   LOS: 1 day   A & P  Abdominal pain, possible acute exacerbation of chronic pancreatitis: Peritoneal fluid obtained prior to admission has had no growth and does not appear to be infected. procalcitonin is negative. CT lends some support to possible exacerbation of chronic pancreatitis. The patient is on a clear liquid diet. He is receiving pain control and antiemetics.  End-stage renal disease, peritoneal dialysis/hyperkalemia:  Patient with no severe volume overload, bicarb down to 22.  Potassium 5.5. No indication for urgent dialysis, will add sodium zirconium x3 doses for hyperkalemia and follow-up kidney function in the morning. Nephrology is aware of the patient's transfer and need for dialysis. No urgent need today as electrolytes and CO2 are within normal limits.  PD Catheter malfunction: Per the patient it was not functioning properly, and he was unable to dialyze yesterday. Several attempts were made to transfer the patient to a facility which could evaluate and address the peritoneal dialysis catheter and fix it. Unfortunately, they were unable to accept the patient as they were on diversion. Nephrology is aware of the patient's transfer and need for dialysis. No urgent need today.  Uncontrolled hypertension: Continue hydralazine 100 mg 3 times daily and metoprolol 50 mg twice daily.  Will increase amlodipine to 10 mg and will change furosemide to intravenous formulation 40 mg daily. Blood pressures are improved today.  GERD: PPI  Type 2 diabetes mellitus (uncontrolled/hyperglycemia) /dyslipidemia: Hemoglobin A1c is 11.2. Glucoses over the past 24 hours have been 251-270. Add insulin sliding scale for glucose coverage  and monitoring. Will add Lantus 10 units daily for better control.   Hyperlipidemia: Continue atorvastatin.  I have seen and examined this patient myself. I have spent 64 minutes in his evaluation and care.  Status is: Inpatient  Remains inpatient appropriate because:IV treatments appropriate due to intensity of illness or inability to take PO   Dispo: The patient is from: Home  Anticipated d/c is to: Home  Anticipated d/c date is: 2 days  Patient currently is not medically stable to d/c.   Zamyah Wiesman, DO [facility] Direct contact: see [email or web address]  7PM-7AM contact night coverage as above 12/13/2019, 2:37 PM  LOS: 1 day

## 2019-12-13 NOTE — Progress Notes (Signed)
Inpatient Diabetes Program Recommendations  AACE/ADA: New Consensus Statement on Inpatient Glycemic Control (2015)  Target Ranges:  Prepandial:   less than 140 mg/dL      Peak postprandial:   less than 180 mg/dL (1-2 hours)      Critically ill patients:  140 - 180 mg/dL   Lab Results  Component Value Date   GLUCAP 270 (H) 12/13/2019   HGBA1C 11.2 (H) 12/12/2019    Review of Glycemic Control  Diabetes history: DM 2 Outpatient Diabetes medications: Humalog SSI, Lantus 22 units qhs (per pt report) Current orders for Inpatient glycemic control:  Novolog 0-6 units tid   Inpatient Diabetes Program Recommendations:    - Add Lantus 10 units (1/2 home dose)  Spoke with pt regarding A1c level at home. Pt reports last seeing MD for Diabetes 5 months ago (however MD has been changed and he has to make a new appt with the new MD from the [place]). Pt started peritoneal dialysis about 4-5 months ago after seeing his MD. Peritoneal fluid often times has dextrose in it. Pt reports at times glucose is 600 at home. Misses Lantus dose 1-2 times in a 2 week period. Take Humalog all the time for glucose >200. Discussed A1c level. Discussed glucose and A1c goals. Pt has all supplies at home he needs. Understands the need for glucose control at home. Pt will schedule follow up for insulin adjustments.  Thanks, Tama Headings RN, MSN, BC-ADM Inpatient Diabetes Coordinator Team Pager [phone] (8a-5p)

## 2019-12-14 DIAGNOSIS — T85611S Breakdown (mechanical) of intraperitoneal dialysis catheter, sequela: Secondary | ICD-10-CM

## 2019-12-14 DIAGNOSIS — K861 Other chronic pancreatitis: Secondary | ICD-10-CM

## 2019-12-14 LAB — GLUCOSE, CAPILLARY
Glucose-Capillary: 296 mg/dL — ABNORMAL HIGH (ref 70–99)
Glucose-Capillary: 161 mg/dL — ABNORMAL HIGH (ref 70–99)
Glucose-Capillary: 440 mg/dL — ABNORMAL HIGH (ref 70–99)
Glucose-Capillary: 197 mg/dL — ABNORMAL HIGH (ref 70–99)
Glucose-Capillary: 200 mg/dL — ABNORMAL HIGH (ref 70–99)

## 2019-12-14 MED ORDER — POLYETHYLENE GLYCOL 3350 17 G PO PACK
17.0000 g | PACK | Freq: Two times a day (BID) | ORAL | Status: DC
  Administered 2019-12-14: 17 g via ORAL
  Filled 2019-12-14: qty 1

## 2019-12-14 MED ORDER — SORBITOL 70 % SOLN
30.0000 mL | Freq: Every day | Status: DC | PRN
  Administered 2019-12-14: 30 mL via ORAL
  Filled 2019-12-14: qty 30

## 2019-12-14 MED ORDER — OXYCODONE HCL 5 MG PO TABS
5.0000 mg | ORAL_TABLET | ORAL | Status: DC | PRN
  Administered 2019-12-14 – 2019-12-15 (×5): 5 mg via ORAL
  Filled 2019-12-14 (×5): qty 1

## 2019-12-14 MED ORDER — POLYETHYLENE GLYCOL 3350 17 G PO PACK
17.0000 g | PACK | Freq: Every day | ORAL | Status: DC
  Administered 2019-12-14: 17 g via ORAL
  Filled 2019-12-14: qty 1

## 2019-12-14 MED ORDER — GENTAMICIN SULFATE 0.1 % EX CREA
1.0000 "application " | TOPICAL_CREAM | Freq: Every day | CUTANEOUS | Status: DC
  Administered 2019-12-14 (×2): 1 via TOPICAL
  Filled 2019-12-14: qty 15

## 2019-12-14 MED ORDER — HYDROMORPHONE HCL 1 MG/ML IJ SOLN
0.5000 mg | INTRAMUSCULAR | Status: DC | PRN
Start: 2019-12-14 — End: 2019-12-14

## 2019-12-14 MED ORDER — HEPARIN SODIUM (PORCINE) 5000 UNIT/ML IJ SOLN: 5000.0000 [IU] | Freq: Three times a day (TID) | INTRAMUSCULAR | Status: DC

## 2019-12-14 MED ORDER — INSULIN GLARGINE 100 UNIT/ML ~~LOC~~ SOLN
10.0000 [IU] | Freq: Every day | SUBCUTANEOUS | Status: DC
  Administered 2019-12-14: 10 [IU] via SUBCUTANEOUS
  Filled 2019-12-14 (×2): qty 0.1

## 2019-12-14 MED ORDER — HEPARIN 1000 UNIT/ML FOR PERITONEAL DIALYSIS
500.0000 [IU] | INTRAMUSCULAR | Status: DC | PRN
  Filled 2019-12-14: qty 0.5

## 2019-12-14 MED ORDER — SORBITOL 70 % SOLN
960.0000 mL | TOPICAL_OIL | Freq: Once | ORAL | Status: DC
  Filled 2019-12-14: qty 473

## 2019-12-14 MED ORDER — HYDROMORPHONE HCL 1 MG/ML IJ SOLN
0.5000 mg | INTRAMUSCULAR | Status: DC | PRN
  Administered 2019-12-14 – 2019-12-15 (×5): 0.5 mg via INTRAVENOUS
  Filled 2019-12-14 (×6): qty 1

## 2019-12-14 MED ORDER — CHLORHEXIDINE GLUCONATE CLOTH 2 % EX PADS
6.0000 | MEDICATED_PAD | Freq: Every day | CUTANEOUS | Status: DC
  Administered 2019-12-14 – 2019-12-15 (×2): 6 via TOPICAL

## 2019-12-14 MED ORDER — INSULIN ASPART 100 UNIT/ML ~~LOC~~ SOLN
5.0000 [IU] | Freq: Three times a day (TID) | SUBCUTANEOUS | Status: DC
  Administered 2019-12-14 – 2019-12-15 (×2): 5 [IU] via SUBCUTANEOUS

## 2019-12-14 MED ORDER — CEFAZOLIN SODIUM-DEXTROSE 2-4 GM/100ML-% IV SOLN
2.0000 g | INTRAVENOUS | Status: AC
  Administered 2019-12-15: 2 g via INTRAVENOUS

## 2019-12-14 NOTE — Progress Notes (Signed)
New Admission Note:   Arrival Method: transfer from [facility] via carelink Mental Orientation: Alert and oriented x4 Telemetry: 5M08, CCMD notified Assessment: Completed Skin: Intact, warm and dry IV: LFA, saline locked Pain: 8/10, provided pain medication Tubes: PD catheter, RLQ Safety Measures: Safety Fall Prevention Plan has been discussed  Admission: completed 5 [place] Orientation: Patient has been orientated to the room, unit and staff.   Family: none at bedside  Orders to be reviewed and implemented. Will continue to monitor the patient. Call light has been placed within reach and bed alarm has been activated.

## 2019-12-14 NOTE — Plan of Care (Signed)
  Problem: Activity: Goal: Risk for activity intolerance will decrease Outcome: Progressing   

## 2019-12-14 NOTE — Plan of Care (Signed)
  Problem: Pain Managment: Goal: General experience of comfort will improve Outcome: Progressing   

## 2019-12-14 NOTE — Progress Notes (Signed)
[facility] Progress Note  Subjective: having pains in his L mid and upper abd, no N/V.  rec'd miralax and sorbitol w/o results yet. No SOB. Voiding a lot of urine.   Vitals:   12/13/19 2033 12/13/19 2328 12/14/19 0310 12/14/19 0800  BP: (!) 162/87 (!) 145/82 140/79 138/79  Pulse: 64 62 61 63  Resp: $Remo'20 18 18 16  'gPAFW$ Temp: 98.1 F (36.7 C) 98.6 F (37 C) 98.2 F (36.8 C) 98 F (36.7 C)  TempSrc:  Oral Oral Oral  SpO2: 100% 100% 98% 99%  Weight:  66 kg    Height:  $Remove'5\' 7"'qLbLVyx$  (1.702 m)      Exam: General: well-appearing, no acute distress HEENT: anicteric sclera, MMM CV: normal rate, no murmurs, no edema Lungs: bilateral chest rise, normal wob Abd: soft, slightly distended, tender to palpation in epigastric and LLQ regions. PD site: c/d/i, no tunnel tenderness Skin: no visible lesions or rashes Psych: alert, engaged, appropriate mood and affect Neuro: normal speech, no gross focal deficits     OP PD: 4 exchange, 1.5h dwell, 1.5 and 2.5% mix , no day bag, last creat 9   Assessment/ Plan: # ESRD on CCPD: pt having difficulty w/ draining, not draining effectively for last 3-4 days. 1st time w/ this issue.  Also has had abd pain and N/V for last few days and is getting laxatives now in case of constipation, but he's not sure that he is constipated given recent GI c/o's.  CT scan shows PD coiled tip in the L mid to upper abdomen in an anterior position. Pt is not uremic and still making urine. PD fluid is not infected.  Creat is 10. I think the catheter needs to be repositioned down in the pelvis but we don't do that here.  Can be done as an outpatient. Have d/w pt's renal MD in [place] (Dr Elita Quick), plan is to place [facility] here, get him dialyzed, get him into HD unit in [place] and dc home, they will take care of getting him to a PD cath specialist in OP setting Rob Hickman, [place]) as they are having issues w/ PD cath surgeon availability as well up in [place].  Have explained to pt that the HD  should be only temporary and that it is very likely that his PD cath is still viable.   # Volume/ hypertension: overall euvolemic. Continue with home anti-HTN's.   # Anemia of Chronic Kidney Disease: Hemoglobin 10.2.  # Secondary Hyperparathyroidism/Hyperphosphatemia: check phos and albumin   # Vascular access: PD cath site c/d/i  # Abdominal pain/chronic pancreatitis: supportive care, mgmt per primary service     Rob Jaimen Melone 12/14/2019, 10:29 AM   Recent Labs  Lab 12/12/19 1425 12/13/19 0602  K 5.5* 4.6  BUN 54* 58*  CREATININE 9.85* 10.01*  CALCIUM 8.6* 8.1*  HGB 12.3* 10.2*   Inpatient medications:  amLODipine  10 mg Oral Daily   calcitRIOL  0.25 mcg Oral Daily   furosemide  40 mg Intravenous Daily   gentamicin cream  1 application Topical Daily   heparin injection (subcutaneous)  5,000 Units Subcutaneous Q8H   hydrALAZINE  100 mg Oral TID   insulin aspart  0-6 Units Subcutaneous TID WC   metoprolol tartrate  50 mg Oral BID   nicotine  21 mg Transdermal Daily   pantoprazole  40 mg Oral Daily   polyethylene glycol  17 g Oral Daily   sorbitol, milk of mag, mineral oil, glycerin (SMOG) enema  960  mL Rectal Once    acetaminophen **OR** acetaminophen, heparin, HYDROmorphone (DILAUDID) injection, ondansetron **OR** ondansetron (ZOFRAN) IV, oxyCODONE, sorbitol

## 2019-12-14 NOTE — Progress Notes (Signed)
Progress Note    Nathan Black  QIW:[id number] DOB: 06/04/1975  DOA: 12/12/2019 PCP: Patient, No Pcp Per    Brief Narrative:     Medical records reviewed and are as summarized below:  Nathan Black is an 44 y.o. male  with medical history significant of end-stage renal disease on peritoneal dialysis, type 2 diabetes mellitus, hypertension and history of chronic alcoholic pancreatitis. Patient reports not feeling well for the last 2 days, generalized malaise, poor appetite, but no frank fevers or chills.  He does peritoneal dialysis at home, last night his dialysis machine alerted him malfunction of the peritoneal drain.  He was not able to complete the full peritoneal dialysis yesterday.  He reports severe abdominal pain for the last 24 hours, mainly on the left side, worse to touch and movement, no improving factors, associated with nausea and vomiting.  No BM for several days.    Assessment/Plan:   Active Problems:   Chronic alcoholic pancreatitis ([place])   Essential hypertension   Type 2 diabetes mellitus with other specified complication (HCC)   ESRD (end stage renal disease) (HCC)   Dependence on continuous ambulatory peritoneal dialysis [facility])   Abdominal pain, possible acute exacerbation of chronic pancreatitis vs constipation Continue supportive medical care with intravenous analgesics with fentanyl, clear liquid diet, antiacids and as needed antiemetics. Consult nephrology to continue peritoneal dialysis.   End-stage renal disease, peritoneal dialysis/hyperkalemia.  Patient with no severe volume overload, bicarb down to 22.  Potassium 5.5.  No indication for urgent dialysis, will add sodium zirconium x3 doses for hyperkalemia and follow-up kidney function in the morning.  Consult nephrology to continue peritoneal dialysis during his hospitalization.  Uncontrolled hypertension.   -Continue hydralazine 100 mg 3 times daily and metoprolol 50 mg twice daily.   -increase  amlodipine to 10 mg    GERD.   -Continue pantoprazole  Type 2 diabetes mellitus (uncontrolled/hyperglycemia) /dyslipidemia..  - SSI -HgbA1c: 11.2  Constipation -bowel regimen -has been "forgetting" to take his miralax at home  Tobacco abuse -nicotine patch   Family Communication/Anticipated D/C date and plan/Code Status   DVT prophylaxis: heparin Code Status: Full Code.  Disposition Plan: Status is: Inpatient  Remains inpatient appropriate because:Inpatient level of care appropriate due to severity of illness   Dispo: The patient is from: Home              Anticipated d/c is to: Home              Anticipated d/c date is: 3 days              Patient currently is not medically stable to d/c.         Medical Consultants:    Nephrology    Subjective:   C/o pain in his left side  Objective:    Vitals:   12/13/19 2328 12/14/19 0310 12/14/19 0800 12/14/19 1146  BP: (!) 145/82 140/79 138/79 (!) 144/87  Pulse: 62 61 63 (!) 59  Resp: $Remo'18 18 16 18  'BxspY$ Temp: 98.6 F (37 C) 98.2 F (36.8 C) 98 F (36.7 C) 98.4 F (36.9 C)  TempSrc: Oral Oral Oral Oral  SpO2: 100% 98% 99% 99%  Weight: 66 kg     Height: $Remove'5\' 7"'dJeNIMX$  (1.702 m)       Intake/Output Summary (Last 24 hours) at 12/14/2019 1207 Last data filed at 12/14/2019 0900 Gross per 24 hour  Intake 480 ml  Output 0 ml  Net 480 ml  Filed Weights   12/12/19 1141 12/13/19 0015 12/13/19 2328  Weight: 68.9 kg 65.4 kg 66 kg    Exam:  General: Appearance:    Well developed, well nourished male in no acute distress     Lungs:     Clear to auscultation bilaterally, respirations unlabored  Heart:    Bradycardic. Normal rhythm. No murmurs, rubs, or gallops.   MS:   All extremities are intact.   Neurologic:   Awake, alert, oriented x 3. No apparent focal neurological           defect.     Data Reviewed:   I have personally reviewed following labs and imaging studies:  Labs: Labs show the following:   Basic  Metabolic Panel: Recent Labs  Lab 12/12/19 1425 12/13/19 0602  NA 132* 131*  K 5.5* 4.6  CL 99 99  CO2 22 23  GLUCOSE 332* 291*  BUN 54* 58*  CREATININE 9.85* 10.01*  CALCIUM 8.6* 8.1*   GFR Estimated Creatinine Clearance: 8.8 mL/min (A) (by C-G formula based on SCr of 10.01 mg/dL (H)). Liver Function Tests: Recent Labs  Lab 12/12/19 1425  AST 12*  ALT 13  ALKPHOS 92  BILITOT 0.6  PROT 6.5  ALBUMIN 3.3*   Recent Labs  Lab 12/12/19 1425 12/13/19 0932  LIPASE 46 59*   No results for input(s): AMMONIA in the last 168 hours. Coagulation profile No results for input(s): INR, PROTIME in the last 168 hours.  CBC: Recent Labs  Lab 12/12/19 1425 12/13/19 0602  WBC 5.6 4.3  HGB 12.3* 10.2*  HCT 40.2 32.5*  MCV 96.6 93.7  PLT 275 225   Cardiac Enzymes: No results for input(s): CKTOTAL, CKMB, CKMBINDEX, TROPONINI in the last 168 hours. BNP (last 3 results) No results for input(s): PROBNP in the last 8760 hours. CBG: Recent Labs  Lab 12/13/19 1656 12/13/19 2037 12/13/19 2330 12/14/19 0636 12/14/19 1144  GLUCAP 154* 176* 199* 296* 161*   D-Dimer: No results for input(s): DDIMER in the last 72 hours. Hgb A1c: Recent Labs    12/12/19 1425  HGBA1C 11.2*   Lipid Profile: No results for input(s): CHOL, HDL, LDLCALC, TRIG, CHOLHDL, LDLDIRECT in the last 72 hours. Thyroid function studies: No results for input(s): TSH, T4TOTAL, T3FREE, THYROIDAB in the last 72 hours.  Invalid input(s): FREET3 Anemia work up: No results for input(s): VITAMINB12, FOLATE, FERRITIN, TIBC, IRON, RETICCTPCT in the last 72 hours. Sepsis Labs: Recent Labs  Lab 12/12/19 1425 12/13/19 0602 12/13/19 0932  PROCALCITON  --   --  0.14  WBC 5.6 4.3  --     Microbiology Recent Results (from the past 240 hour(s))  Gram stain     Status: None   Collection Time: 12/12/19  3:05 PM   Specimen: Peritoneal Washings; Body Fluid  Result Value Ref Range Status   Specimen Description  PERITONEAL  Final   Special Requests NONE  Final   Gram Stain   Final    Performed at [facility] NO WBC SEEN NO ORGANISMS SEEN Performed at [facility], [address], [place], [place] [address]    Report Status 12/12/2019 FINAL  Final  Body fluid culture     Status: None (Preliminary result)   Collection Time: 12/12/19  3:05 PM   Specimen: Peritoneal Washings  Result Value Ref Range Status   Specimen Description   Final    PERITONEAL Performed at [facility], [address], [place], [place] [address]    Special  Requests   Final    NONE Performed at [facility], [address], [place], [place] [address]    Gram Stain NO WBC SEEN NO ORGANISMS SEEN CYTOSPIN SMEAR   Final   Culture   Final    NO GROWTH 2 DAYS Performed at [facility], [place] [address], [place], [place] [address]    Report Status PENDING  Incomplete  SARS Coronavirus 2 by RT PCR (hospital order, performed in [facility] hospital lab) Nasopharyngeal Nasopharyngeal Swab     Status: None   Collection Time: 12/12/19  5:50 PM   Specimen: Nasopharyngeal Swab  Result Value Ref Range Status   SARS Coronavirus 2 NEGATIVE NEGATIVE Final    Comment: (NOTE) SARS-CoV-2 target nucleic acids are NOT DETECTED.  The SARS-CoV-2 RNA is generally detectable in upper and lower respiratory specimens during the acute phase of infection. The lowest concentration of SARS-CoV-2 viral copies this assay can detect is 250 copies / mL. A negative result does not preclude SARS-CoV-2 infection and should not be used as the sole basis for treatment or other patient management decisions.  A negative result may occur with improper specimen collection / handling, submission of specimen other than nasopharyngeal swab, presence of viral mutation(s) within the areas targeted by this assay, and inadequate number of viral copies (<250 copies / mL). A negative result must be combined with clinical observations, patient history,  and epidemiological information.  Fact Sheet for Patients:   StrictlyIdeas.no  Fact Sheet for Healthcare Providers: BankingDealers.co.za  This test is not yet approved or  cleared by the [place] FDA and has been authorized for detection and/or diagnosis of SARS-CoV-2 by FDA under an Emergency Use Authorization (EUA).  This EUA will remain in effect (meaning this test can be used) for the duration of the COVID-19 declaration under Section 564(b)(1) of the Act, 21 U.S.C. section 360bbb-3(b)(1), unless the authorization is terminated or revoked sooner.  Performed at [facility], [address], [place], [place] [address]   MRSA PCR Screening     Status: None   Collection Time: 12/13/19 12:40 AM   Specimen: Nasal Mucosa; Nasopharyngeal  Result Value Ref Range Status   MRSA by PCR NEGATIVE NEGATIVE Final    Comment:        The GeneXpert MRSA Assay (FDA approved for NASAL specimens only), is one component of a comprehensive MRSA colonization surveillance program. It is not intended to diagnose MRSA infection nor to guide or monitor treatment for MRSA infections. Performed at [facility], [address], [place], [place] [address]   Culture, blood (routine x 2)     Status: None (Preliminary result)   Collection Time: 12/13/19  9:32 AM   Specimen: Right Antecubital; Blood  Result Value Ref Range Status   Specimen Description   Final    RIGHT ANTECUBITAL BOTTLES DRAWN AEROBIC AND ANAEROBIC   Special Requests Blood Culture adequate volume  Final   Culture   Final    NO GROWTH < 24 HOURS Performed at [facility], [address] [place], [place] [address]    Report Status PENDING  Incomplete  Culture, blood (routine x 2)     Status: None (Preliminary result)   Collection Time: 12/13/19  9:33 AM   Specimen: BLOOD RIGHT HAND  Result Value Ref Range Status   Specimen Description BLOOD RIGHT HAND BOTTLES DRAWN AEROBIC ONLY  Final    Special Requests Blood Culture adequate volume  Final   Culture   Final  NO GROWTH < 24 HOURS Performed at [facility], [address], [place], [place] [address]    Report Status PENDING  Incomplete    Procedures and diagnostic studies:  CT ABDOMEN PELVIS WO CONTRAST  Result Date: 12/12/2019 CLINICAL DATA:  Abdominal pain, nausea and vomiting, peritoneal dialysis EXAM: CT ABDOMEN AND PELVIS WITHOUT CONTRAST TECHNIQUE: Multidetector CT imaging of the abdomen and pelvis was performed following the standard protocol without IV contrast. COMPARISON:  03/15/2019 FINDINGS: Lower chest: No acute pleural or parenchymal lung disease. Hepatobiliary: No focal liver abnormality is seen. No gallstones, gallbladder wall thickening, or biliary dilatation. Pancreas: Diffuse pancreatic calcifications consistent with chronic calcific pancreatitis. No acute inflammatory change. Spleen: Normal in size without focal abnormality. Adrenals/Urinary Tract: No urinary tract calculi or obstructive uropathy. The adrenals are unremarkable. The bladder is normal. Stomach/Bowel: No bowel obstruction or ileus. No wall thickening or inflammatory change. Normal appendix right lower quadrant. Vascular/Lymphatic: Aortic atherosclerosis. No enlarged abdominal or pelvic lymph nodes. Reproductive: Prostate is unremarkable. Other: Small volume free fluid throughout the abdomen and pelvis likely related to peritoneal dialysis. Peritoneal dialysis catheter is coiled within the left upper quadrant. No free intraperitoneal gas. No abdominal wall hernia. Musculoskeletal: No acute or destructive bony lesions. Reconstructed images demonstrate no additional findings. IMPRESSION: 1. Small volume of free fluid throughout the abdomen and pelvis likely reflecting residual fluid from peritoneal dialysis. 2. Sequela of chronic calcific pancreatitis. 3. Otherwise unremarkable unenhanced exam. Electronically Signed   By: Randa Ngo M.D.   On:  12/12/2019 15:48    Medications:   . amLODipine  10 mg Oral Daily  . calcitRIOL  0.25 mcg Oral Daily  . Chlorhexidine Gluconate Cloth  6 each Topical Q0600  . heparin injection (subcutaneous)  5,000 Units Subcutaneous Q8H  . hydrALAZINE  100 mg Oral TID  . insulin aspart  0-6 Units Subcutaneous TID WC  . metoprolol tartrate  50 mg Oral BID  . nicotine  21 mg Transdermal Daily  . pantoprazole  40 mg Oral Daily  . polyethylene glycol  17 g Oral Daily  . sorbitol, milk of mag, mineral oil, glycerin (SMOG) enema  960 mL Rectal Once   Continuous Infusions:   LOS: 2 days   Geradine Girt  [facility]   How to contact the [facility] Attending or Consulting provider [place] or covering provider during after hours [place], for this patient?  1. Check the care team in [facility] and look for a) attending/consulting [facility] provider listed and b) the [facility] team listed 2. Log into [email or web address] and use [place]'s universal password to access. If you do not have the password, please contact the hospital operator. 3. Locate the [facility] provider you are looking for under [facility] and page to a number that you can be directly reached. 4. If you still have difficulty reaching the provider, please page the [facility] (Director on Call) for the Hospitalists listed on amion for assistance.  12/14/2019, 12:07 PM

## 2019-12-14 NOTE — Plan of Care (Signed)
°  Problem: Safety: Goal: Ability to remain free from injury will improve Outcome: Progressing   Problem: Activity: Goal: Risk for activity intolerance will decrease Outcome: Progressing

## 2019-12-14 NOTE — Progress Notes (Signed)
textpaged  night oncall provider MD [place], patient asking for miralax for constipation, awaiting response

## 2019-12-14 NOTE — Progress Notes (Signed)
Renal Navigator received notification from Dr. Schertz/Nephrologist that patient needs in-center HD temporarily while PD catheter is evaluated (outpatient after discharge), with plans to return to PD when able.  Navigator spoke with both patient's Home Therapy clinic in [place] and the in-center HD clinic in [place] ([employer] [employer]) to explain situation and request an in-center seat for patient as soon as possible so that patient can be discharged after [facility] and HD start. Navigator has submitted in-center referral in [facility] portal and will follow closely. Navigator requested HepB panel for this referral and will fax records when labs result. All other pertinent records have been sent.  Alphonzo Cruise, [place] Renal Navigator [phone]

## 2019-12-14 NOTE — Progress Notes (Signed)
Patient reports last BM 12/10/2019 with degree of constipation. Instructed patient constipation can cause malfunction of PD catheter pt verbalizes understanding states usually takes Miralax; however, forgot to take it. Discussed with pt's primary nurse Angus Palms, RN patient need for medication for constipation. Angus Palms states will check on medication to administer to pt for constipation relief.

## 2019-12-14 NOTE — Consult Note (Signed)
Chief Complaint: Patient was seen in consultation today for image guided tunneled hemodialysis catheter placement  Chief Complaint  Patient presents with  . Abdominal Pain    Referring Physician(s): Schertz,R  Supervising Physician: Markus Daft  Patient Status: [facility] - In-pt  History of Present Illness: Nathan Black is a 44 y.o. male with PMH DM,HTN, HLD, anemia, chronic pancreatitis, secondary hyperparathyroidism and ESRD on CCPD. He was recently admitted to [facility] with abd pain, intermittent N/V and poorly functioning PD catheter which will need to be addressed with PD specialist at outside facility. Request now received from nephrology for tunneled HD cath placement in the interim.   Past Medical History:  Diagnosis Date  . Diabetes mellitus without complication ([place])   . Hypertension   . Pancreatitis   . Renal disorder     Past Surgical History:  Procedure Laterality Date  . PERITONEAL SHUNT      Allergies: Lisinopril, Iodinated diagnostic agents, and Morphine  Medications: Prior to Admission medications   Medication Sig Start Date End Date Taking? Authorizing Provider  amLODipine (NORVASC) 5 MG tablet Take 5 mg by mouth daily.   Yes [provider]  atorvastatin (LIPITOR) 40 MG tablet Take 40 mg by mouth daily.   Yes [provider]  calcitRIOL (ROCALTROL) 0.25 MCG capsule Take 0.25 mcg by mouth daily.   Yes [provider]  furosemide (LASIX) 40 MG tablet Take 40 mg by mouth daily.   Yes [provider]  gentamicin cream (GARAMYCIN) 0.1 % Apply 1 application topically daily. Wound site 11/23/19  Yes [provider]  hydrALAZINE (APRESOLINE) 100 MG tablet Take 100 mg by mouth 3 (three) times daily.   Yes [provider]  Ibuprofen-diphenhydrAMINE Cit (ADVIL PM PO) Take 1 tablet by mouth at bedtime as needed (pain/sleep).   Yes [provider]  insulin lispro (HUMALOG) 100 UNIT/ML injection Inject 0-8 Units  into the skin with breakfast, with lunch, and with evening meal.   Yes [provider]  lanthanum (FOSRENOL) 1000 MG chewable tablet Chew 1,000 mg by mouth 4 (four) times daily. 12/07/19  Yes [provider]  LANTUS SOLOSTAR 100 UNIT/ML Solostar Pen Inject 22 Units into the skin at bedtime. 08/27/19  Yes [provider]  metoprolol tartrate (LOPRESSOR) 50 MG tablet Take 50 mg by mouth 2 (two) times daily.   Yes [provider]  pantoprazole (PROTONIX) 40 MG tablet Take 40 mg by mouth daily.   Yes [provider]     History reviewed. No pertinent family history.  Social History   Socioeconomic History  . Marital status: Legally Separated    Spouse name: Not on file  . Number of children: Not on file  . Years of education: Not on file  . Highest education level: Not on file  Occupational History  . Not on file  Tobacco Use  . Smoking status: Current Some Day Smoker    Packs/day: 1.00    Types: Cigarettes  . Smokeless tobacco: Never Used  Substance and Sexual Activity  . Alcohol use: Not Currently  . Drug use: Not Currently  . Sexual activity: Not on file  Other Topics Concern  . Not on file  Social History Narrative  . Not on file   Social Determinants of Health   Financial Resource Strain:   . Difficulty of Paying Living Expenses: Not on file  Food Insecurity:   . Worried About Charity fundraiser in the Last Year: Not on file  .  Ran Out of Food in the Last Year: Not on file  Transportation Needs:   . Lack of Transportation (Medical): Not on file  . Lack of Transportation (Non-Medical): Not on file  Physical Activity:   . Days of Exercise per Week: Not on file  . Minutes of Exercise per Session: Not on file  Stress:   . Feeling of Stress : Not on file  Social Connections:   . Frequency of Communication with Friends and Family: Not on file  . Frequency of Social Gatherings with Friends and Family: Not on file  . Attends  Religious Services: Not on file  . Active Member of Clubs or Organizations: Not on file  . Attends Archivist Meetings: Not on file  . Marital Status: Not on file      Review of Systems currently denies fever, HA, CP, dyspnea, cough, N/V or bleeding; he does have left sided abd pain with radiation to left flank region  Vital Signs: BP (!) 144/87 (BP Location: Left Arm)   Pulse (!) 59   Temp 98.4 F (36.9 C) (Oral)   Resp 18   Ht $R'5\' 7"'uH$  (1.702 m)   Wt 145 lb 8.1 oz (66 kg)   SpO2 99%   BMI 22.79 kg/m   Physical Exam;  awake/alert; chest- CTA bilat; heart- RRR; abd- soft,+BS,mild dist, tender left lat abd/flank regions; intact rt perit drain, site NT; no LE edema  Imaging: CT ABDOMEN PELVIS WO CONTRAST  Result Date: 12/12/2019 CLINICAL DATA:  Abdominal pain, nausea and vomiting, peritoneal dialysis EXAM: CT ABDOMEN AND PELVIS WITHOUT CONTRAST TECHNIQUE: Multidetector CT imaging of the abdomen and pelvis was performed following the standard protocol without IV contrast. COMPARISON:  03/15/2019 FINDINGS: Lower chest: No acute pleural or parenchymal lung disease. Hepatobiliary: No focal liver abnormality is seen. No gallstones, gallbladder wall thickening, or biliary dilatation. Pancreas: Diffuse pancreatic calcifications consistent with chronic calcific pancreatitis. No acute inflammatory change. Spleen: Normal in size without focal abnormality. Adrenals/Urinary Tract: No urinary tract calculi or obstructive uropathy. The adrenals are unremarkable. The bladder is normal. Stomach/Bowel: No bowel obstruction or ileus. No wall thickening or inflammatory change. Normal appendix right lower quadrant. Vascular/Lymphatic: Aortic atherosclerosis. No enlarged abdominal or pelvic lymph nodes. Reproductive: Prostate is unremarkable. Other: Small volume free fluid throughout the abdomen and pelvis likely related to peritoneal dialysis. Peritoneal dialysis catheter is coiled within the left upper  quadrant. No free intraperitoneal gas. No abdominal wall hernia. Musculoskeletal: No acute or destructive bony lesions. Reconstructed images demonstrate no additional findings. IMPRESSION: 1. Small volume of free fluid throughout the abdomen and pelvis likely reflecting residual fluid from peritoneal dialysis. 2. Sequela of chronic calcific pancreatitis. 3. Otherwise unremarkable unenhanced exam. Electronically Signed   By: Randa Ngo M.D.   On: 12/12/2019 15:48    Labs:  CBC: Recent Labs    12/12/19 1425 12/13/19 0602  WBC 5.6 4.3  HGB 12.3* 10.2*  HCT 40.2 32.5*  PLT 275 225    COAGS: No results for input(s): INR, APTT in the last 8760 hours.  BMP: Recent Labs    12/12/19 1425 12/13/19 0602  NA 132* 131*  K 5.5* 4.6  CL 99 99  CO2 22 23  GLUCOSE 332* 291*  BUN 54* 58*  CALCIUM 8.6* 8.1*  CREATININE 9.85* 10.01*  GFRNONAA 6* 6*  GFRAA 7* 7*    LIVER FUNCTION TESTS: Recent Labs    12/12/19 1425  BILITOT 0.6  AST 12*  ALT 13  ALKPHOS  92  PROT 6.5  ALBUMIN 3.3*    TUMOR MARKERS: No results for input(s): AFPTM, CEA, CA199, CHROMGRNA in the last 8760 hours.  Assessment and Plan: 44 y.o. male with PMH DM,HTN, HLD, anemia, chronic pancreatitis, secondary hyperparathyroidism and ESRD on CCPD. He was recently admitted to [facility] with abd pain, intermittent N/V and poorly functioning PD catheter which will need to be addressed with PD specialist at outside facility. Request now received from nephrology for tunneled HD cath placement in the interim. Details/risks of procedure, including but not limited to, internal bleeding, infection, injury to adjacent structures d/w pt with his understanding and consent.   Pt afebrile; WBC nl; hgb 10.2, plts nl; COVID-19 neg; last K nl; blood cx pend; perit fl cx neg to date Procedure planned for 9/23  Thank you for this interesting consult.  I greatly enjoyed meeting Nathan Black and look forward to participating in their care.  A  copy of this report was sent to the requesting provider on this date.  Electronically Signed: D. Rowe Robert, PA-C 12/14/2019, 1:33 PM   I spent a total of 25 minutes    in face to face in clinical consultation, greater than 50% of which was counseling/coordinating care for tunneled hemodialysis catheter placement

## 2019-12-15 ENCOUNTER — Inpatient Hospital Stay (HOSPITAL_COMMUNITY): Payer: Medicaid - Out of State

## 2019-12-15 DIAGNOSIS — T85611A Breakdown (mechanical) of intraperitoneal dialysis catheter, initial encounter: Principal | ICD-10-CM

## 2019-12-15 HISTORY — PX: IR US GUIDE VASC ACCESS RIGHT: IMG2390

## 2019-12-15 HISTORY — PX: IR FLUORO GUIDE CV LINE RIGHT: IMG2283

## 2019-12-15 LAB — HEPATITIS B SURFACE ANTIBODY,QUALITATIVE: Hep B S Ab: REACTIVE — AB

## 2019-12-15 LAB — HEPATITIS B CORE ANTIBODY, IGM: Hep B C IgM: NONREACTIVE

## 2019-12-15 LAB — CBC
HCT: 31.3 % — ABNORMAL LOW (ref 39.0–52.0)
Hemoglobin: 9.8 g/dL — ABNORMAL LOW (ref 13.0–17.0)
MCH: 29.3 pg (ref 26.0–34.0)
MCHC: 31.3 g/dL (ref 30.0–36.0)
MCV: 93.4 fL (ref 80.0–100.0)
Platelets: 200 10*3/uL (ref 150–400)
RBC: 3.35 MIL/uL — ABNORMAL LOW (ref 4.22–5.81)
RDW: 14.4 % (ref 11.5–15.5)
WBC: 4.5 10*3/uL (ref 4.0–10.5)
nRBC: 0 % (ref 0.0–0.2)

## 2019-12-15 LAB — DIFFERENTIAL
Abs Immature Granulocytes: 0.02 10*3/uL (ref 0.00–0.07)
Basophils Absolute: 0 10*3/uL (ref 0.0–0.1)
Basophils Relative: 0 %
Eosinophils Absolute: 0.2 10*3/uL (ref 0.0–0.5)
Eosinophils Relative: 4 %
Immature Granulocytes: 0 %
Lymphocytes Relative: 12 %
Lymphs Abs: 0.5 10*3/uL — ABNORMAL LOW (ref 0.7–4.0)
Monocytes Absolute: 0.4 10*3/uL (ref 0.1–1.0)
Monocytes Relative: 8 %
Neutro Abs: 3.4 10*3/uL (ref 1.7–7.7)
Neutrophils Relative %: 76 %

## 2019-12-15 LAB — HEPATITIS B SURFACE ANTIGEN: Hepatitis B Surface Ag: NONREACTIVE

## 2019-12-15 LAB — RENAL FUNCTION PANEL
Albumin: 2.6 g/dL — ABNORMAL LOW (ref 3.5–5.0)
Anion gap: 8 (ref 5–15)
BUN: 40 mg/dL — ABNORMAL HIGH (ref 6–20)
CO2: 26 mmol/L (ref 22–32)
Calcium: 7.6 mg/dL — ABNORMAL LOW (ref 8.9–10.3)
Chloride: 103 mmol/L (ref 98–111)
Creatinine, Ser: 6.65 mg/dL — ABNORMAL HIGH (ref 0.61–1.24)
GFR calc Af Amer: 11 mL/min — ABNORMAL LOW (ref >60)
GFR calc non Af Amer: 9 mL/min — ABNORMAL LOW (ref >60)
Glucose, Bld: 151 mg/dL — ABNORMAL HIGH (ref 70–99)
Phosphorus: 3.9 mg/dL (ref 2.5–4.6)
Potassium: 4.4 mmol/L (ref 3.5–5.1)
Sodium: 137 mmol/L (ref 135–145)

## 2019-12-15 LAB — GLUCOSE, CAPILLARY
Glucose-Capillary: 256 mg/dL — ABNORMAL HIGH (ref 70–99)
Glucose-Capillary: 170 mg/dL — ABNORMAL HIGH (ref 70–99)

## 2019-12-15 LAB — PROCALCITONIN: Procalcitonin: 0.21 ng/mL

## 2019-12-15 MED ORDER — POLYETHYLENE GLYCOL 3350 17 G PO PACK: 17.0000 g | PACK | Freq: Two times a day (BID) | ORAL | 0 refills | Status: AC

## 2019-12-15 MED ORDER — FENTANYL CITRATE (PF) 100 MCG/2ML IJ SOLN
INTRAMUSCULAR | Status: AC
  Filled 2019-12-15: qty 2

## 2019-12-15 MED ORDER — OXYCODONE HCL 5 MG PO TABS
5.0000 mg | ORAL_TABLET | Freq: Four times a day (QID) | ORAL | 0 refills | Status: AC | PRN
Start: 2019-12-15 — End: 2019-12-18

## 2019-12-15 MED ORDER — HEPARIN SODIUM (PORCINE) 1000 UNIT/ML IJ SOLN
INTRAMUSCULAR | Status: AC
  Filled 2019-12-15: qty 1

## 2019-12-15 MED ORDER — INSULIN GLARGINE 100 UNIT/ML ~~LOC~~ SOLN: 20.0000 [IU] | Freq: Every day | SUBCUTANEOUS | Status: DC

## 2019-12-15 MED ORDER — MIDAZOLAM HCL 2 MG/2ML IJ SOLN
INTRAMUSCULAR | Status: AC | PRN
  Administered 2019-12-15: 0.5 mg via INTRAVENOUS

## 2019-12-15 MED ORDER — CHLORHEXIDINE GLUCONATE 4 % EX LIQD
CUTANEOUS | Status: AC
  Filled 2019-12-15: qty 15

## 2019-12-15 MED ORDER — LIDOCAINE-EPINEPHRINE 1 %-1:100000 IJ SOLN
INTRAMUSCULAR | Status: AC | PRN
  Administered 2019-12-15: 20 mL via INTRADERMAL

## 2019-12-15 MED ORDER — HEPARIN SODIUM (PORCINE) 1000 UNIT/ML IJ SOLN
INTRAMUSCULAR | Status: AC | PRN
  Administered 2019-12-15: 1000 [IU] via INTRAVENOUS

## 2019-12-15 MED ORDER — CHLORHEXIDINE GLUCONATE 4 % EX LIQD
CUTANEOUS | Status: AC | PRN
  Administered 2019-12-15: 1 via TOPICAL

## 2019-12-15 MED ORDER — OXYCODONE HCL 5 MG PO TABS
ORAL_TABLET | ORAL | Status: AC
  Filled 2019-12-15: qty 1

## 2019-12-15 MED ORDER — MIDAZOLAM HCL 2 MG/2ML IJ SOLN
INTRAMUSCULAR | Status: AC
  Filled 2019-12-15: qty 2

## 2019-12-15 MED ORDER — CEFAZOLIN SODIUM-DEXTROSE 2-4 GM/100ML-% IV SOLN
INTRAVENOUS | Status: AC
  Filled 2019-12-15: qty 100

## 2019-12-15 MED ORDER — HYDROMORPHONE HCL 1 MG/ML IJ SOLN
INTRAMUSCULAR | Status: AC
  Administered 2019-12-15: 0.5 mg via INTRAVENOUS
  Filled 2019-12-15: qty 0.5

## 2019-12-15 MED ORDER — LIDOCAINE-EPINEPHRINE 1 %-1:100000 IJ SOLN
INTRAMUSCULAR | Status: AC
  Filled 2019-12-15: qty 1

## 2019-12-15 NOTE — Discharge Instructions (Signed)
Follow up with PCP for titration of you insulin Go to HD on Monday

## 2019-12-15 NOTE — Progress Notes (Signed)
[facility] Progress Note  Subjective: no c/o today, wants to go home  Vitals:   12/15/19 1015 12/15/19 1323 12/15/19 1400 12/15/19 1430  BP: (!) 167/74 (!) 150/83 138/72 130/60  Pulse: 71 66 66 63  Resp: $Remo'12 13  15  'EITAY$ Temp:  98 F (36.7 C)    TempSrc:  Oral    SpO2: 97%     Weight:      Height:        Exam: General: well-appearing, no acute distress HEENT: anicteric sclera, MMM CV: normal rate, no murmurs, no edema Lungs: bilateral chest rise, normal wob Abd: soft, slightly distended, tender to palpation in epigastric and LLQ regions. 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 Worked better on his left side once. CT shows PD cath tip in L mid-upper abdomen. Not constipated. The cath will need to be repositioned, but we do not have PD cath surgery here. Have d/w pt's neph and surgeon in [place]. Plan is for temp HD w/ TDC (placed this am). He is on HD now. He will most likely be dc'd after HD tonight and then his next HD will be in [place] this Monday 9/27 at 1 pm.  We will give him a CD w/ his CT scan on it for his surgeon's to look at.   # Volume/ hypertension: overall euvolemic. Continue with home anti-HTN's.   # Anemia of Chronic Kidney Disease: Hemoglobin 10.2.  # Secondary Hyperparathyroidism/Hyperphosphatemia: check phos and albumin   # Vascular access: PD cath site c/d/i  # Abdominal pain/chronic pancreatitis: supportive care, mgmt per primary service     Rob Gerson Fauth 12/15/2019, 2:48 PM   Recent Labs  Lab 12/12/19 1425 12/13/19 0602  K 5.5* 4.6  BUN 54* 58*  CREATININE 9.85* 10.01*  CALCIUM 8.6* 8.1*   HGB 12.3* 10.2*   Inpatient medications: . amLODipine  10 mg Oral Daily  . calcitRIOL  0.25 mcg Oral Daily  . chlorhexidine      . Chlorhexidine Gluconate Cloth  6 each Topical Q0600  . heparin injection (subcutaneous)  5,000 Units Subcutaneous Q8H  . heparin sodium (porcine)      . hydrALAZINE  100 mg Oral TID  . insulin aspart  0-6 Units Subcutaneous TID WC  . insulin aspart  5 Units Subcutaneous TID WC  . [START ON 12/16/2019] insulin glargine  20 Units Subcutaneous Daily  . lidocaine-EPINEPHrine      . metoprolol tartrate  50 mg Oral BID  . midazolam      . nicotine  21 mg Transdermal Daily  . pantoprazole  40 mg Oral Daily  . polyethylene glycol  17 g Oral BID  . sorbitol, milk of mag, mineral oil, glycerin (SMOG) enema  960 mL Rectal Once   . ceFAZolin     acetaminophen **OR** acetaminophen, HYDROmorphone (DILAUDID) injection, ondansetron **OR** ondansetron (ZOFRAN) IV, oxyCODONE, sorbitol

## 2019-12-15 NOTE — Progress Notes (Addendum)
Renal Navigator received message from OP HD clinic that patient can start in center for HD Monday, 12/19/19 at 1:00pm. HepB labs faxed to [facility] Admissions. Nephrologist/Dr. Jonnie Finner aware. Navigator sent secure message to Attending.  Alphonzo Cruise, [place] Renal Navigator [phone]

## 2019-12-15 NOTE — Discharge Summary (Signed)
Physician Discharge Summary  Oryon Gary WIO:[id number] DOB: 1975-06-22 DOA: 12/12/2019  PCP: Patient, No Pcp Per  Admit date: 12/12/2019 Discharge date: 12/15/2019  Admitted From: home Discharge disposition: Home   Recommendations for Outpatient Follow-Up:   1. Patient to follow-up with his surgeon for repositioning of his PD catheter 2. Patient is continue his bowel regimen 3. 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  Diet recommendation: Renal/carb mod  Wound care: None.  Code status: Full.   History of Present Illness:   Nathan Black is an 44 y.o. male  with medical history significant ofend-stage renal disease on peritoneal dialysis, type 2 diabetes mellitus, hypertension and history of chronic alcoholic pancreatitis. Patient reports not feeling well for the last 2 days, generalized malaise, poor appetite, but no frank fevers or chills. He does peritoneal dialysis at home, last night his dialysis machine alerted him malfunction of the peritoneal drain. He was not able to complete the full peritoneal dialysis yesterday.  He reports severe abdominal pain for the last 24 hours, mainly on the left side, worse to touch and movement, no improving factors, associated with nausea and vomiting.  No BM for several days.    Hospital Course by Problem:   Abdominal pain,possible acute exacerbation of chronic pancreatitis vs constipation -Resolved  End-stage renal disease, peritoneal dialysis/hyperkalemia.Patient with no severevolume overload, bicarb down to 22.-Due to his PD catheter not working a hemodialysis catheter  was placed and patient was started on hemodialysis -Patient will need to follow-up with his surgeon  Uncontrolled hypertension.  -Continue hydralazine 100 mg 3 times daily and metoprolol 50 mg twice daily.  -increase amlodipine to 10 mg    GERD. -Continue pantoprazole  Type 2 diabetes mellitus (uncontrolled/hyperglycemia) /dyslipidemia.. -SSI -HgbA1c: 11.2 -Needs titration of medications  Constipation -bowel regimen -has been "forgetting" to take his miralax at home  Tobacco abuse -nicotine patch     Medical Consultants:    Renal IR  Discharge Exam:   Vitals:   12/15/19 1530 12/15/19 1720  BP: 137/72   Pulse: 62   Resp:    Temp:  (P) 98.4 F (36.9 C)  SpO2:     Vitals:   12/15/19 1400 12/15/19 1430 12/15/19 1530 12/15/19 1720  BP: 138/72 130/60 137/72   Pulse: 66 63 62   Resp:  15    Temp:    (P) 98.4 F (36.9 C)  TempSrc:    (P) Oral  SpO2:      Weight:      Height:        General exam: Appears calm and comfortable.    The results of significant diagnostics from this hospitalization (including imaging, microbiology, ancillary and laboratory) are listed below for reference.     Procedures and Diagnostic Studies:   CT ABDOMEN PELVIS WO CONTRAST  Result Date: 12/12/2019 CLINICAL DATA:  Abdominal pain, nausea and vomiting, peritoneal dialysis EXAM: CT ABDOMEN AND PELVIS WITHOUT CONTRAST TECHNIQUE: Multidetector CT imaging of the abdomen and pelvis was performed following the standard protocol without IV contrast. COMPARISON:  03/15/2019 FINDINGS: Lower chest: No acute pleural or parenchymal lung disease. Hepatobiliary: No focal liver abnormality is  seen. No gallstones, gallbladder wall thickening, or biliary dilatation. Pancreas: Diffuse pancreatic calcifications consistent with chronic calcific pancreatitis. No acute inflammatory change. Spleen: Normal in size without focal abnormality. Adrenals/Urinary Tract: No urinary tract calculi or  obstructive uropathy. The adrenals are unremarkable. The bladder is normal. Stomach/Bowel: No bowel obstruction or ileus. No wall thickening or inflammatory change. Normal appendix right lower quadrant. Vascular/Lymphatic: Aortic atherosclerosis. No enlarged abdominal or pelvic lymph nodes. Reproductive: Prostate is unremarkable. Other: Small volume free fluid throughout the abdomen and pelvis likely related to peritoneal dialysis. Peritoneal dialysis catheter is coiled within the left upper quadrant. No free intraperitoneal gas. No abdominal wall hernia. Musculoskeletal: No acute or destructive bony lesions. Reconstructed images demonstrate no additional findings. IMPRESSION: 1. Small volume of free fluid throughout the abdomen and pelvis likely reflecting residual fluid from peritoneal dialysis. 2. Sequela of chronic calcific pancreatitis. 3. Otherwise unremarkable unenhanced exam. Electronically Signed   By: Randa Ngo M.D.   On: 12/12/2019 15:48     Labs:   Basic Metabolic Panel: Recent Labs  Lab 12/12/19 1425 12/12/19 1425 12/13/19 0602 12/15/19 1430  NA 132*  --  131* 137  K 5.5*   < > 4.6 4.4  CL 99  --  99 103  CO2 22  --  23 26  GLUCOSE 332*  --  291* 151*  BUN 54*  --  58* 40*  CREATININE 9.85*  --  10.01* 6.65*  CALCIUM 8.6*  --  8.1* 7.6*  PHOS  --   --   --  3.9   < > = values in this interval not displayed.   GFR Estimated Creatinine Clearance: 13.3 mL/min (A) (by C-G formula based on SCr of 6.65 mg/dL (H)). Liver Function Tests: Recent Labs  Lab 12/12/19 1425 12/15/19 1430  AST 12*  --   ALT 13  --   ALKPHOS 92  --   BILITOT 0.6  --   PROT 6.5  --   ALBUMIN 3.3* 2.6*   Recent Labs  Lab 12/12/19 1425 12/13/19 0932  LIPASE 46 59*   No results for input(s): AMMONIA in the last 168 hours. Coagulation profile No results for input(s): INR, PROTIME in the last 168 hours.  CBC: Recent Labs  Lab 12/12/19 1425 12/13/19 0602 12/15/19 1440  WBC 5.6 4.3 4.5   NEUTROABS  --   --  3.4  HGB 12.3* 10.2* 9.8*  HCT 40.2 32.5* 31.3*  MCV 96.6 93.7 93.4  PLT 275 225 200   Cardiac Enzymes: No results for input(s): CKTOTAL, CKMB, CKMBINDEX, TROPONINI in the last 168 hours. BNP: Invalid input(s): POCBNP CBG: Recent Labs  Lab 12/14/19 1620 12/14/19 1845 12/14/19 2040 12/15/19 0647 12/15/19 1120  GLUCAP 440* 197* 200* 256* 170*   D-Dimer No results for input(s): DDIMER in the last 72 hours. Hgb A1c No results for input(s): HGBA1C in the last 72 hours. Lipid Profile No results for input(s): CHOL, HDL, LDLCALC, TRIG, CHOLHDL, LDLDIRECT in the last 72 hours. Thyroid function studies No results for input(s): TSH, T4TOTAL, T3FREE, THYROIDAB in the last 72 hours.  Invalid input(s): FREET3 Anemia work up No results for input(s): VITAMINB12, FOLATE, FERRITIN, TIBC, IRON, RETICCTPCT in the last 72 hours. Microbiology Recent Results (from the past 240 hour(s))  Gram stain     Status: None   Collection Time: 12/12/19  3:05 PM   Specimen: Peritoneal Washings; Body Fluid  Result Value Ref Range Status   Specimen Description PERITONEAL  Final   Special Requests  NONE  Final   Gram Stain   Final    Performed at [facility] NO WBC SEEN NO ORGANISMS SEEN Performed at [facility], [address], [place], [place] [address]    Report Status 12/12/2019 FINAL  Final  Body fluid culture     Status: None (Preliminary result)   Collection Time: 12/12/19  3:05 PM   Specimen: Peritoneal Washings  Result Value Ref Range Status   Specimen Description   Final    PERITONEAL Performed at [facility], [address] [place], [place] [address]    Special Requests   Final    NONE Performed at [facility], [address], [place], [place] [address]    Gram Stain NO WBC SEEN NO ORGANISMS SEEN CYTOSPIN SMEAR   Final   Culture   Final    NO GROWTH 3 DAYS Performed at [facility], [place] [address], [place], [place] [address]    Report Status  PENDING  Incomplete  SARS Coronavirus 2 by RT PCR (hospital order, performed in [facility] hospital lab) Nasopharyngeal Nasopharyngeal Swab     Status: None   Collection Time: 12/12/19  5:50 PM   Specimen: Nasopharyngeal Swab  Result Value Ref Range Status   SARS Coronavirus 2 NEGATIVE NEGATIVE Final    Comment: (NOTE) SARS-CoV-2 target nucleic acids are NOT DETECTED.  The SARS-CoV-2 RNA is generally detectable in upper and lower respiratory specimens during the acute phase of infection. The lowest concentration of SARS-CoV-2 viral copies this assay can detect is 250 copies / mL. A negative result does not preclude SARS-CoV-2 infection and should not be used as the sole basis for treatment or other patient management decisions.  A negative result may occur with improper specimen collection / handling, submission of specimen other than nasopharyngeal swab, presence of viral mutation(s) within the areas targeted by this assay, and inadequate number of viral copies (<250 copies / mL). A negative result must be combined with clinical observations, patient history, and epidemiological information.  Fact Sheet for Patients:   StrictlyIdeas.no  Fact Sheet for Healthcare Providers: BankingDealers.co.za  This test is not yet approved or  cleared by the [place] FDA and has been authorized for detection and/or diagnosis of SARS-CoV-2 by FDA under an Emergency Use Authorization (EUA).  This EUA will remain in effect (meaning this test can be used) for the duration of the COVID-19 declaration under Section 564(b)(1) of the Act, 21 U.S.C. section 360bbb-3(b)(1), unless the authorization is terminated or revoked sooner.  Performed at [facility], [address], [place], [place] [address]   MRSA PCR Screening     Status: None   Collection Time: 12/13/19 12:40 AM   Specimen: Nasal Mucosa; Nasopharyngeal  Result Value Ref Range Status    MRSA by PCR NEGATIVE NEGATIVE Final    Comment:        The GeneXpert MRSA Assay (FDA approved for NASAL specimens only), is one component of a comprehensive MRSA colonization surveillance program. It is not intended to diagnose MRSA infection nor to guide or monitor treatment for MRSA infections. Performed at [facility], [address], [place], [place] [address]   Culture, blood (routine x 2)     Status: None (Preliminary result)   Collection Time: 12/13/19  9:32 AM   Specimen: Right Antecubital; Blood  Result Value Ref Range Status   Specimen Description   Final    RIGHT ANTECUBITAL BOTTLES DRAWN AEROBIC AND ANAEROBIC   Special Requests Blood  Culture adequate volume  Final   Culture   Final    NO GROWTH 2 DAYS Performed at [facility], [address], [place], [place] [address]    Report Status PENDING  Incomplete  Culture, blood (routine x 2)     Status: None (Preliminary result)   Collection Time: 12/13/19  9:33 AM   Specimen: BLOOD RIGHT HAND  Result Value Ref Range Status   Specimen Description BLOOD RIGHT HAND BOTTLES DRAWN AEROBIC ONLY  Final   Special Requests Blood Culture adequate volume  Final   Culture   Final    NO GROWTH 2 DAYS Performed at [facility], [address] [place], [place] [address]    Report Status PENDING  Incomplete     Discharge Instructions:   Discharge Instructions    Discharge instructions   Complete by: As directed    start in center for HD Monday, 12/19/19 at 1:00pm Renal/carb mod diet   No wound care   Complete by: As directed      Allergies as of 12/15/2019      Reactions   Lisinopril    swelling   Iodinated Diagnostic Agents    Stage 3 kidney disease   Morphine    Other reaction(s): Unknown      Medication List    STOP taking these medications   ADVIL PM PO   furosemide 40 MG tablet Commonly known as: LASIX     TAKE these medications   amLODipine 5 MG tablet Commonly known as: NORVASC Take 5 mg by mouth  daily.   atorvastatin 40 MG tablet Commonly known as: LIPITOR Take 40 mg by mouth daily.   calcitRIOL 0.25 MCG capsule Commonly known as: ROCALTROL Take 0.25 mcg by mouth daily.   gentamicin cream 0.1 % Commonly known as: GARAMYCIN Apply 1 application topically daily. Wound site   hydrALAZINE 100 MG tablet Commonly known as: APRESOLINE Take 100 mg by mouth 3 (three) times daily.   insulin lispro 100 UNIT/ML injection Commonly known as: HUMALOG Inject 0-8 Units into the skin with breakfast, with lunch, and with evening meal.   lanthanum 1000 MG chewable tablet Commonly known as: FOSRENOL Chew 1,000 mg by mouth 4 (four) times daily.   Lantus SoloStar 100 UNIT/ML Solostar Pen Generic drug: insulin glargine Inject 22 Units into the skin at bedtime.   metoprolol tartrate 50 MG tablet Commonly known as: LOPRESSOR Take 50 mg by mouth 2 (two) times daily.   oxyCODONE 5 MG immediate release tablet Commonly known as: Oxy IR/ROXICODONE Take 1 tablet (5 mg total) by mouth every 6 (six) hours as needed for up to 3 days for moderate pain, severe pain or breakthrough pain.   pantoprazole 40 MG tablet Commonly known as: PROTONIX Take 40 mg by mouth daily.   polyethylene glycol 17 g packet Commonly known as: MIRALAX / GLYCOLAX Take 17 g by mouth 2 (two) times daily.         Time coordinating discharge: 35 min  Signed:  Geradine Girt DO  [facility] 12/15/2019, 6:08 PM

## 2019-12-15 NOTE — Procedures (Signed)
Interventional Radiology Procedure:   Indications: ESRD and PD catheter dysfunction.  Needs hemodialysis  Procedure: Tunneled dialysis catheter placement  Findings: Right IJ Palindrome, tip at SVC/RA junction  Complications: None     EBL: less than 10 ml  Plan: Catheter is ready to use.     Grayland Daisey R. Anselm Pancoast, MD  Pager: [phone]

## 2019-12-16 LAB — BODY FLUID CULTURE
Culture: NO GROWTH
Gram Stain: NONE SEEN

## 2019-12-16 NOTE — Progress Notes (Signed)
NURSING PROGRESS NOTE  Nathan Black [id number] Discharge Data: 12/16/2019 10:37 AM Attending Provider: No att. providers found [id number], No Pcp Per     Tilford Pillar to be D/C'd Home per MD order.  Discussed with the patient the After Visit Summary and all questions fully answered. All IV's discontinued with no bleeding noted. All belongings returned to patient for patient to take home.   Pt was provided a CD with CT scan  imaging done on ABD & pelvis.   Last Vital Signs:  Blood pressure (!) 148/58, pulse 63, temperature 98.4 F (36.9 C), temperature source Oral, resp. rate 15, height $RemoveBe'5\' 7"'pSPaEcOBw$  (1.702 m), weight 65.7 kg, SpO2 97 %.  Discharge Medication List Allergies as of 12/15/2019      Reactions   Lisinopril    swelling   Iodinated Diagnostic Agents    Stage 3 kidney disease   Morphine    Other reaction(s): Unknown      Medication List    STOP taking these medications   ADVIL PM PO   furosemide 40 MG tablet Commonly known as: LASIX     TAKE these medications   amLODipine 5 MG tablet Commonly known as: NORVASC Take 5 mg by mouth daily. Notes to patient: 12/16/2019   atorvastatin 40 MG tablet Commonly known as: LIPITOR Take 40 mg by mouth daily. Notes to patient: 12/15/2019   calcitRIOL 0.25 MCG capsule Commonly known as: ROCALTROL Take 0.25 mcg by mouth daily. Notes to patient: 12/16/2019   gentamicin cream 0.1 % Commonly known as: GARAMYCIN Apply 1 application topically daily. Wound site Notes to patient: 12/16/2019   hydrALAZINE 100 MG tablet Commonly known as: APRESOLINE Take 100 mg by mouth 3 (three) times daily. Notes to patient: 12/15/2019   insulin lispro 100 UNIT/ML injection Commonly known as: HUMALOG Inject 0-8 Units into the skin with breakfast, with lunch, and with evening meal. Notes to patient: 12/15/2019   lanthanum 1000 MG chewable tablet Commonly known as: FOSRENOL Chew 1,000 mg by mouth 4 (four) times daily.   Lantus SoloStar 100 UNIT/ML  Solostar Pen Generic drug: insulin glargine Inject 22 Units into the skin at bedtime.   metoprolol tartrate 50 MG tablet Commonly known as: LOPRESSOR Take 50 mg by mouth 2 (two) times daily. Notes to patient: 12/15/2019   oxyCODONE 5 MG immediate release tablet Commonly known as: Oxy IR/ROXICODONE Take 1 tablet (5 mg total) by mouth every 6 (six) hours as needed for up to 3 days for moderate pain, severe pain or breakthrough pain.   pantoprazole 40 MG tablet Commonly known as: PROTONIX Take 40 mg by mouth daily. Notes to patient: 12/16/2019   polyethylene glycol 17 g packet Commonly known as: MIRALAX / GLYCOLAX Take 17 g by mouth 2 (two) times daily. Notes to patient: 12/15/2019

## 2019-12-18 LAB — CULTURE, BLOOD (ROUTINE X 2)
Culture: NO GROWTH
Culture: NO GROWTH
Special Requests: ADEQUATE
Special Requests: ADEQUATE

## 2021-07-31 IMAGING — XA IR FLUORO GUIDE CV LINE*R*
1 series · 1 of 1 positions shown · non-contrast
Comparison: none

INDICATION: 44-year-old with end-stage renal disease and dysfunction of
peritoneal dialysis catheter. Patient needs a tunneled hemodialysis
catheter.

[Series 1: fl (-) angio · 1 of 1 slices shown]
[im 1/1]
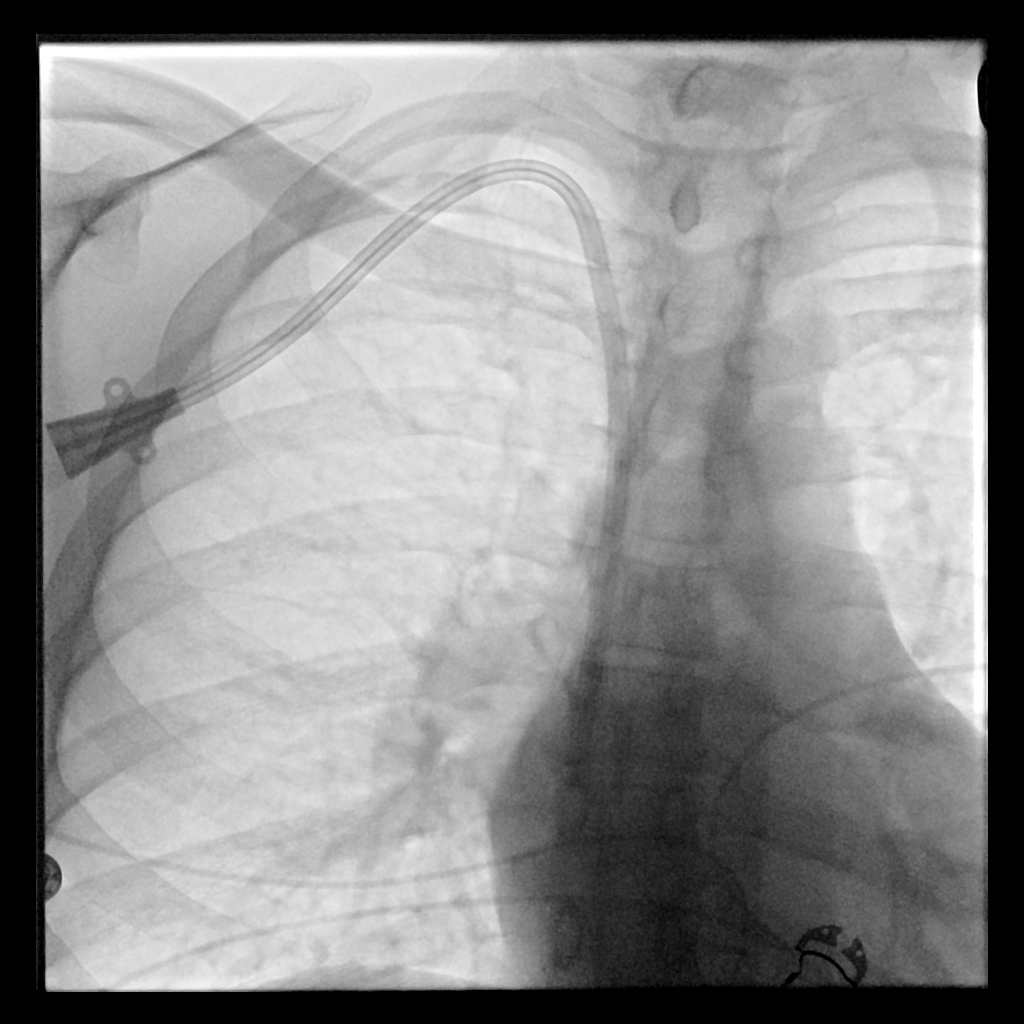

[1 of 1 positions shown; findings below may reference images not displayed]

EXAM:
FLUOROSCOPIC AND ULTRASOUND GUIDED PLACEMENT OF A TUNNELED DIALYSIS
CATHETER

MEDICATIONS:
Ancef 2 g; The antibiotic was administered within an appropriate
time interval prior to skin puncture.

ANESTHESIA/SEDATION:
Versed 0.5 mg

The patient was continuously monitored during the procedure by the
interventional radiology nurse under my direct supervision.

FLUOROSCOPY TIME:  Fluoroscopy Time: 48 seconds, 3 mGy

COMPLICATIONS:
None immediate.

PROCEDURE:
The procedure was explained to the patient. The risks and benefits
of the procedure were discussed and the patient's questions were
addressed. Informed consent was obtained from the patient. The
patient was placed supine on the interventional table. Ultrasound
confirmed a patent right internal jugular vein. Ultrasound images
were obtained for documentation. The right neck and chest was
prepped and draped in a sterile fashion. Maximal barrier sterile
technique was utilized including caps, mask, sterile gowns, sterile
gloves, sterile drape, hand hygiene and skin antiseptic. The right
neck was anesthetized with 1% lidocaine. A small incision was made
with #11 blade scalpel. A 21 gauge needle directed into the right
internal jugular vein with ultrasound guidance. A micropuncture
dilator set was placed. A 19 cm tip to cuff Palindrome catheter was
selected. The skin below the right clavicle was anesthetized and a
small incision was made with an #11 blade scalpel. A subcutaneous
tunnel was formed to the vein dermatotomy site. The catheter was
brought through the tunnel. The vein dermatotomy site was dilated to
accommodate a peel-away sheath. The catheter was placed through the
peel-away sheath and directed into the central venous structures.
The tip of the catheter was placed at the superior cavoatrial
junction with fluoroscopy. Fluoroscopic images were obtained for
documentation. Both lumens were found to aspirate and flush well.
The proper amount of heparin was flushed in both lumens. The vein
dermatotomy site was closed using a single layer of absorbable
suture and Dermabond. The catheter was secured to the skin using
Prolene suture.
IMPRESSION: Successful placement of a right jugular tunneled dialysis catheter
using ultrasound and fluoroscopic guidance.
